# Patient Record
Sex: Female | Born: 1995 | Race: Black or African American | Hispanic: No | Marital: Single | Smoking: Never smoker
Health system: Southern US, Community
[De-identification: ages and names within clinical notes are randomized; demographics above are authoritative.]

## PROBLEM LIST (undated history)

## (undated) DIAGNOSIS — J45909 Unspecified asthma, uncomplicated: Secondary | ICD-10-CM

## (undated) DIAGNOSIS — L309 Dermatitis, unspecified: Secondary | ICD-10-CM

## (undated) DIAGNOSIS — D649 Anemia, unspecified: Secondary | ICD-10-CM

## (undated) DIAGNOSIS — IMO0002 Reserved for concepts with insufficient information to code with codable children: Secondary | ICD-10-CM

## (undated) DIAGNOSIS — F32A Depression, unspecified: Secondary | ICD-10-CM

## (undated) DIAGNOSIS — T7840XA Allergy, unspecified, initial encounter: Secondary | ICD-10-CM

## (undated) DIAGNOSIS — K219 Gastro-esophageal reflux disease without esophagitis: Secondary | ICD-10-CM

## (undated) DIAGNOSIS — R002 Palpitations: Secondary | ICD-10-CM

## (undated) DIAGNOSIS — F419 Anxiety disorder, unspecified: Secondary | ICD-10-CM

## (undated) HISTORY — PX: KNEE ARTHROSCOPY: SUR90

## (undated) HISTORY — DX: Allergy, unspecified, initial encounter: T78.40XA

## (undated) HISTORY — PX: WISDOM TOOTH EXTRACTION: SHX21

## (undated) HISTORY — DX: Reserved for concepts with insufficient information to code with codable children: IMO0002

## (undated) HISTORY — DX: Depression, unspecified: F32.A

## (undated) HISTORY — PX: UPPER GI ENDOSCOPY: SHX6162

---

## 2018-10-23 DIAGNOSIS — J45909 Unspecified asthma, uncomplicated: Secondary | ICD-10-CM | POA: Insufficient documentation

## 2018-11-03 ENCOUNTER — Encounter (HOSPITAL_COMMUNITY): Payer: Self-pay | Admitting: Emergency Medicine

## 2018-11-03 ENCOUNTER — Emergency Department (HOSPITAL_COMMUNITY)
Admission: EM | Admit: 2018-11-03 | Discharge: 2018-11-03 | Disposition: A | Discharge status: Home / Self Care | Payer: BC Managed Care – PPO | Attending: Emergency Medicine | Admitting: Emergency Medicine

## 2018-11-03 ENCOUNTER — Other Ambulatory Visit: Payer: Self-pay

## 2018-11-03 DIAGNOSIS — Z79899 Other long term (current) drug therapy: Secondary | ICD-10-CM | POA: Diagnosis not present

## 2018-11-03 DIAGNOSIS — F419 Anxiety disorder, unspecified: Secondary | ICD-10-CM | POA: Insufficient documentation

## 2018-11-03 DIAGNOSIS — Z91040 Latex allergy status: Secondary | ICD-10-CM | POA: Diagnosis not present

## 2018-11-03 DIAGNOSIS — R000 Tachycardia, unspecified: Secondary | ICD-10-CM | POA: Diagnosis present

## 2018-11-03 DIAGNOSIS — F410 Panic disorder [episodic paroxysmal anxiety] without agoraphobia: Secondary | ICD-10-CM

## 2018-11-03 HISTORY — DX: Unspecified asthma, uncomplicated: J45.909

## 2018-11-03 HISTORY — DX: Anxiety disorder, unspecified: F41.9

## 2018-11-03 HISTORY — DX: Dermatitis, unspecified: L30.9

## 2018-11-03 MED ORDER — LORAZEPAM 0.5 MG PO TABS: 0.5000 mg | ORAL_TABLET | Freq: Two times a day (BID) | ORAL | 0 refills | Status: DC | PRN

## 2018-11-03 MED ORDER — LORAZEPAM 1 MG PO TABS
1.0000 mg | ORAL_TABLET | Freq: Once | ORAL | Status: AC
  Administered 2018-11-03: 1 mg via ORAL
  Filled 2018-11-03: qty 1

## 2018-11-03 NOTE — Discharge Instructions (Signed)
Avoid stimulants, such as caffeine, as well as drug use. We advise follow up with a primary care doctor regarding your persistent anxiety. Use Ativan as needed for severe symptoms.

## 2018-11-03 NOTE — ED Triage Notes (Addendum)
Patient heart is racing and having anxiety issues. Patient states that it started out of no where. Patient used CBD oil earlier tonight.

## 2018-11-03 NOTE — ED Provider Notes (Signed)
[facility]-EMERGENCY DEPT Provider Note   CSN: [id number] Arrival date & time: 11/03/18  0132    History   Chief Complaint Chief Complaint  Patient presents with  . Tachycardia  . Anxiety    HPI Cheyenne Schwartz is a 22 y.o. female.  22 year old female with a history of anxiety, asthma, eczema presents to the emergency department for worsening shortness of breath and palpitations.  States that her heart has been racing intermittently since experiencing worsening anxiety.  She states that her semester has been more stressful.  She also frequently worries about her sister who has a history of seizure disorder.  She has experienced a globus sensation with her palpitations this evening.  No inability to swallow, drooling.  Her symptoms feel heightened when she is trying to go to sleep.  Has been taking Atarax for her eczema.  Was told by her doctor that this should help with her anxiety, but she does not feel as though it is controlling the symptoms.  Denies any situational stressors tonight.  Was given some CBD oil earlier by her roommate to try and help alleviate her palpitations and shortness of breath.  Denies any other stimulant or illicit drug use.  The history is provided by the patient. No language interpreter was used.    Past Medical History:  Diagnosis Date  . Anxiety   . Asthma   . Eczema     There are no active problems to display for this patient.   History reviewed. No pertinent surgical history.   OB History   None      Home Medications    Prior to Admission medications   Medication Sig Start Date End Date Taking? Authorizing Provider  hydrOXYzine (ATARAX/VISTARIL) 25 MG tablet Take 25-50 mg by mouth every 8 (eight) hours as needed for itching.  08/06/18  Yes [provider]  montelukast (SINGULAIR) 10 MG tablet Take 10 mg by mouth daily. 08/26/18  Yes [provider]  PROAIR RESPICLICK 108 (90 Base) MCG/ACT AEPB Inhale 2  puffs into the lungs every 6 (six) hours as needed (SOB, wheezing).  10/30/18  Yes [provider]  LORazepam (ATIVAN) 0.5 MG tablet Take 1 tablet (0.5 mg total) by mouth every 12 (twelve) hours as needed for anxiety. 11/03/18   Antony MaduraHumes, Harvie Morua, PA-C    Family History History reviewed. No pertinent family history.  Social History Social History   Tobacco Use  . Smoking status: Never Smoker  . Smokeless tobacco: Never Used  Substance Use Topics  . Alcohol use: Yes    Frequency: Never  . Drug use: Never     Allergies   Chocolate; Latex; Pollen extract; and Shellfish allergy   Review of Systems Review of Systems Ten systems reviewed and are negative for acute change, except as noted in the HPI.    Physical Exam Updated Vital Signs BP 130/81 (BP Location: Left Arm)   Pulse 99   Temp 97.9 F (36.6 C) (Oral)   Resp 15   Ht 5\' 5"  (1.651 m)   Wt 77.1 kg   LMP 10/21/2018   SpO2 100%   BMI 28.29 kg/m   Physical Exam  Constitutional: She is oriented to person, place, and time. She appears well-developed and well-nourished. No distress.  Visibly anxious. In NAD.  HENT:  Head: Normocephalic and atraumatic.  Eyes: Conjunctivae and EOM are normal. No scleral icterus.  Neck: Normal range of motion.  Cardiovascular: Regular rhythm and intact distal pulses.  Tachycardic  Pulmonary/Chest: Effort normal. No stridor. No respiratory distress. She has no wheezes.  Dyspneic without tachypnea. Lungs CTAB.  Musculoskeletal: Normal range of motion.  No BLE edema.  Neurological: She is alert and oriented to person, place, and time. She exhibits normal muscle tone. Coordination normal.  Skin: Skin is warm and dry. No rash noted. She is not diaphoretic. No erythema. No pallor.  Psychiatric: Her behavior is normal. Her mood appears anxious.  Nursing note and vitals reviewed.    ED Treatments / Results  Labs (all labs ordered are listed, but only abnormal results are  displayed) Labs Reviewed - No data to display  EKG EKG Interpretation  Date/Time:  Sunday November 03 2018 01:52:43 EST Ventricular Rate:  102 PR Interval:    QRS Duration: 76 QT Interval:  323 QTC Calculation: 421 R Axis:   38 Text Interpretation:  Sinus tachycardia Multiple premature complexes, vent & supraven Probable left atrial enlargement NO STEMI No old tracing to compare Confirmed by Cardama, Pedro ([id number]) on 11/03/2018 4:53:46 AM   Radiology No results found.  Procedures Procedures (including critical care time)  Medications Ordered in ED Medications  LORazepam (ATIVAN) tablet 1 mg (1 mg Oral Given 11/03/18 0229)     Initial Impression / Assessment and Plan / ED Course  I have reviewed the triage vital signs and the nursing notes.  Pertinent labs & imaging results that were available during my care of the patient were reviewed by me and considered in my medical decision making (see chart for details).     22 year old female presents to the ED for complaints of palpitations with shortness of breath and globus sensation.  Has been experiencing intermittent anxiety attacks.  Is not currently on any maintenance medications for anxiety.  She had symptomatic improvable in the ED following administration of Ativan.  She has never had any tachypnea, dyspnea, hypoxia.  No leg swelling, hemoptysis, recent surgeries or hospitalizations.  She is not on birth control.  Palpitations attributed to worsening anxiety.  The patient has been advised to follow-up with her primary care doctor to discuss daily maintenance medications for ongoing management.  She was given a short course of Ativan for home use.  Return precautions discussed and provided. Patient discharged in stable condition with no unaddressed concerns.   Final Clinical Impressions(s) / ED Diagnoses   Final diagnoses:  Anxiety attack    ED Discharge Orders         Ordered    LORazepam (ATIVAN) 0.5 MG tablet  Every  12 hours PRN     12 /08/19 0403           Antony Madura, PA-C 11/03/18 0457    Nira Conn, MD 11/03/18 [id number]

## 2018-12-08 ENCOUNTER — Emergency Department (HOSPITAL_COMMUNITY): Payer: BC Managed Care – PPO

## 2018-12-08 ENCOUNTER — Encounter (HOSPITAL_COMMUNITY): Payer: Self-pay | Admitting: Emergency Medicine

## 2018-12-08 ENCOUNTER — Emergency Department (HOSPITAL_COMMUNITY)
Admission: EM | Admit: 2018-12-08 | Discharge: 2018-12-08 | Disposition: A | Discharge status: Home / Self Care | Payer: BC Managed Care – PPO | Attending: Emergency Medicine | Admitting: Emergency Medicine

## 2018-12-08 ENCOUNTER — Other Ambulatory Visit: Payer: Self-pay

## 2018-12-08 DIAGNOSIS — R000 Tachycardia, unspecified: Secondary | ICD-10-CM | POA: Insufficient documentation

## 2018-12-08 DIAGNOSIS — R070 Pain in throat: Secondary | ICD-10-CM | POA: Insufficient documentation

## 2018-12-08 DIAGNOSIS — J45909 Unspecified asthma, uncomplicated: Secondary | ICD-10-CM | POA: Insufficient documentation

## 2018-12-08 DIAGNOSIS — E876 Hypokalemia: Secondary | ICD-10-CM | POA: Insufficient documentation

## 2018-12-08 DIAGNOSIS — Z79899 Other long term (current) drug therapy: Secondary | ICD-10-CM | POA: Insufficient documentation

## 2018-12-08 DIAGNOSIS — R0602 Shortness of breath: Secondary | ICD-10-CM | POA: Insufficient documentation

## 2018-12-08 DIAGNOSIS — R030 Elevated blood-pressure reading, without diagnosis of hypertension: Secondary | ICD-10-CM | POA: Insufficient documentation

## 2018-12-08 DIAGNOSIS — R1013 Epigastric pain: Secondary | ICD-10-CM | POA: Diagnosis not present

## 2018-12-08 DIAGNOSIS — R0789 Other chest pain: Secondary | ICD-10-CM | POA: Insufficient documentation

## 2018-12-08 DIAGNOSIS — R1310 Dysphagia, unspecified: Secondary | ICD-10-CM | POA: Insufficient documentation

## 2018-12-08 DIAGNOSIS — R002 Palpitations: Secondary | ICD-10-CM | POA: Insufficient documentation

## 2018-12-08 DIAGNOSIS — Z91040 Latex allergy status: Secondary | ICD-10-CM | POA: Insufficient documentation

## 2018-12-08 DIAGNOSIS — R079 Chest pain, unspecified: Secondary | ICD-10-CM

## 2018-12-08 LAB — BASIC METABOLIC PANEL
Anion gap: 9 (ref 5–15)
BUN: <5 mg/dL — ABNORMAL LOW (ref 6–20)
CO2: 22 mmol/L (ref 22–32)
CREATININE: 0.71 mg/dL (ref 0.44–1.00)
Calcium: 10.1 mg/dL (ref 8.9–10.3)
Chloride: 102 mmol/L (ref 98–111)
GFR calc Af Amer: >60 mL/min (ref >60)
GFR calc non Af Amer: >60 mL/min (ref >60)
Glucose, Bld: 103 mg/dL — ABNORMAL HIGH (ref 70–99)
Potassium: 3.4 mmol/L — ABNORMAL LOW (ref 3.5–5.1)
Sodium: 133 mmol/L — ABNORMAL LOW (ref 135–145)

## 2018-12-08 LAB — CBC WITH DIFFERENTIAL/PLATELET
Abs Immature Granulocytes: 0.02 10*3/uL (ref 0.00–0.07)
Basophils Absolute: 0.1 10*3/uL (ref 0.0–0.1)
Basophils Relative: 1 %
Eosinophils Absolute: 0.1 10*3/uL (ref 0.0–0.5)
Eosinophils Relative: 2 %
HCT: 37.2 % (ref 36.0–46.0)
Hemoglobin: 11.7 g/dL — ABNORMAL LOW (ref 12.0–15.0)
Immature Granulocytes: 0 %
Lymphocytes Relative: 23 %
Lymphs Abs: 1.5 10*3/uL (ref 0.7–4.0)
MCH: 24 pg — ABNORMAL LOW (ref 26.0–34.0)
MCHC: 31.5 g/dL (ref 30.0–36.0)
MCV: 76.4 fL — ABNORMAL LOW (ref 80.0–100.0)
Monocytes Absolute: 0.6 10*3/uL (ref 0.1–1.0)
Monocytes Relative: 9 %
Neutro Abs: 4.4 10*3/uL (ref 1.7–7.7)
Neutrophils Relative %: 65 %
Platelets: 404 10*3/uL — ABNORMAL HIGH (ref 150–400)
RBC: 4.87 MIL/uL (ref 3.87–5.11)
RDW: 15.9 % — ABNORMAL HIGH (ref 11.5–15.5)
WBC: 6.7 10*3/uL (ref 4.0–10.5)
nRBC: 0 % (ref 0.0–0.2)

## 2018-12-08 LAB — I-STAT BETA HCG BLOOD, ED (MC, WL, AP ONLY)

## 2018-12-08 LAB — D-DIMER, QUANTITATIVE: D-Dimer, Quant: 1.17 ug/mL-FEU — ABNORMAL HIGH (ref 0.00–0.50)

## 2018-12-08 LAB — TSH: TSH: 0.439 u[IU]/mL (ref 0.350–4.500)

## 2018-12-08 MED ORDER — SODIUM CHLORIDE 0.9 % IV BOLUS
1000.0000 mL | Freq: Once | INTRAVENOUS | Status: AC
  Administered 2018-12-08: 1000 mL via INTRAVENOUS

## 2018-12-08 MED ORDER — PANTOPRAZOLE SODIUM 20 MG PO TBEC: 20.0000 mg | DELAYED_RELEASE_TABLET | Freq: Every day | ORAL | 0 refills | Status: DC

## 2018-12-08 MED ORDER — IOPAMIDOL (ISOVUE-370) INJECTION 76%
INTRAVENOUS | Status: AC
  Administered 2018-12-08: 50 mL
  Filled 2018-12-08: qty 50

## 2018-12-08 MED ORDER — IOPAMIDOL (ISOVUE-370) INJECTION 76%
INTRAVENOUS | Status: AC
  Filled 2018-12-08: qty 50

## 2018-12-08 MED ORDER — POTASSIUM CHLORIDE CRYS ER 20 MEQ PO TBCR
40.0000 meq | EXTENDED_RELEASE_TABLET | Freq: Once | ORAL | Status: AC
  Administered 2018-12-08: 40 meq via ORAL
  Filled 2018-12-08: qty 2

## 2018-12-08 NOTE — ED Provider Notes (Signed)
[facility] EMERGENCY DEPARTMENT Provider Note   CSN: [id number] Arrival date & time: 12/08/18  1441     History   Chief Complaint Chief Complaint  Patient presents with  . chest wall pain    HPI Cheyenne Schwartz is a 23 y.o. female who presents with chest pain. PMH significant for anxiety, asthma, eczema. She states that she's been having central chest pain and throat pain for the past week. She has been seen for this problem twice - once here on 12/8 and once at [facility] on 12/19. She states that the pain is intermittent. It radiates to her throat. She reports associated palpitations. The palpitations are a chronic problem and she has seen cardiology in the past and had normal holter monitoring and echocardiogram. No aggravating or alleviating factors. She has been taking Zoloft for anxiety for the past three weeks and feels it is helping her anxiety and she reports not feeling very stressed. After she was seen at [facility] the pain went away for about 2 weeks and now it's back. No fever, chills, URI symptoms, cough, SOB, abdominal pain, N/V. She reports dysphagia and odynophagia. She had an appointment with GI but cancelled it. She tried Omeprazole for 2 weeks but this didn't help.   HPI  Past Medical History:  Diagnosis Date  . Anxiety   . Asthma   . Eczema     There are no active problems to display for this patient.   History reviewed. No pertinent surgical history.   OB History   No obstetric history on file.      Home Medications    Prior to Admission medications   Medication Sig Start Date End Date Taking? Authorizing Provider  hydrOXYzine (ATARAX/VISTARIL) 25 MG tablet Take 25-50 mg by mouth every 8 (eight) hours as needed for itching.  08/06/18   [provider]  LORazepam (ATIVAN) 0.5 MG tablet Take 1 tablet (0.5 mg total) by mouth every 12 (twelve) hours as needed for anxiety. 11/03/18   Antony Madura, PA-C  montelukast (SINGULAIR) 10 MG  tablet Take 10 mg by mouth daily. 08/26/18   [provider]  PROAIR RESPICLICK 108 (90 Base) MCG/ACT AEPB Inhale 2 puffs into the lungs every 6 (six) hours as needed (SOB, wheezing).  10/30/18   [provider]    Family History No family history on file.  Social History Social History   Tobacco Use  . Smoking status: Never Smoker  . Smokeless tobacco: Never Used  Substance Use Topics  . Alcohol use: Yes    Frequency: Never  . Drug use: Never     Allergies   Chocolate; Latex; Pollen extract; and Shellfish allergy   Review of Systems Review of Systems  Constitutional: Negative for fever.  HENT: Positive for sore throat and trouble swallowing. Negative for congestion, rhinorrhea and sneezing.   Respiratory: Negative for cough, shortness of breath and wheezing.   Cardiovascular: Positive for chest pain and palpitations. Negative for leg swelling.  Gastrointestinal: Negative for abdominal pain, nausea and vomiting.  Genitourinary: Negative for dysuria.  Neurological: Negative for syncope and light-headedness.  All other systems reviewed and are negative.    Physical Exam Updated Vital Signs BP (!) 139/101   Pulse (!) 110   Temp 98.6 F (37 C) (Oral)   Resp 16   Ht 5\' 5"  (1.651 m)   Wt 73.9 kg   LMP 11/12/2018   SpO2 100%   BMI 27.12 kg/m   Physical Exam  Vitals signs and nursing note reviewed.  Constitutional:      General: She is not in acute distress.    Appearance: Normal appearance. She is well-developed and well-groomed. She is not ill-appearing.  HENT:     Head: Normocephalic and atraumatic.     Mouth/Throat:     Mouth: Mucous membranes are moist.     Pharynx: Oropharynx is clear.  Eyes:     General: No scleral icterus.       Right eye: No discharge.        Left eye: No discharge.     Conjunctiva/sclera: Conjunctivae normal.     Pupils: Pupils are equal, round, and reactive to light.  Neck:     Musculoskeletal: Normal range of  motion.  Cardiovascular:     Rate and Rhythm: Tachycardia present.  Pulmonary:     Effort: Pulmonary effort is normal. No respiratory distress.     Breath sounds: Normal breath sounds.  Chest:     Chest wall: Tenderness (upper, central chest wall) present.  Abdominal:     General: Abdomen is flat. There is no distension.     Tenderness: There is abdominal tenderness (mild epigastric).  Skin:    General: Skin is warm and dry.  Neurological:     Mental Status: She is alert and oriented to person, place, and time.  Psychiatric:        Behavior: Behavior normal. Behavior is cooperative.      ED Treatments / Results  Labs (all labs ordered are listed, but only abnormal results are displayed) Labs Reviewed  BASIC METABOLIC PANEL - Abnormal; Notable for the following components:      Result Value   Sodium 133 (*)    Potassium 3.4 (*)    Glucose, Bld 103 (*)    BUN <5 (*)    All other components within normal limits  CBC WITH DIFFERENTIAL/PLATELET - Abnormal; Notable for the following components:   Hemoglobin 11.7 (*)    MCV 76.4 (*)    MCH 24.0 (*)    RDW 15.9 (*)    Platelets 404 (*)    All other components within normal limits  D-DIMER, QUANTITATIVE (NOT AT [facility]) - Abnormal; Notable for the following components:   D-Dimer, Quant 1.17 (*)    All other components within normal limits  TSH  I-STAT BETA HCG BLOOD, ED ([facility], [facility], [facility] ONLY)    EKG None  Radiology Ct Angio Chest Pe W/cm &/or Wo Cm  Result Date: 12/08/2018 CLINICAL DATA:  One month history of intermittent chest pain. EXAM: CT ANGIOGRAPHY CHEST WITH CONTRAST TECHNIQUE: Multidetector CT imaging of the chest was performed using the standard protocol during bolus administration of intravenous contrast. Multiplanar CT image reconstructions and MIPs were obtained to evaluate the vascular anatomy. CONTRAST:  50mL ISOVUE-370 IOPAMIDOL INJECTION 76% IV. COMPARISON:  None. FINDINGS: Cardiovascular: Contrast opacification of  pulmonary arteries is very good. No filling defects within either main pulmonary artery or their segmental branches in either lung to suggest pulmonary embolism. Upper normal heart size. No pericardial effusion. No coronary atherosclerosis. No visible atherosclerosis involving the thoracic or proximal abdominal aorta. Mediastinum/Nodes: No pathologically enlarged mediastinal, hilar or axillary lymph nodes. No mediastinal masses. Normal-appearing esophagus. Normal-appearing thyroid gland. Residual thymic tissue in the anterior superior mediastinum. Lungs/Pleura: Lung parenchyma clear without airspace consolidation, interstitial disease, or parenchymal nodules or masses. Central airways patent with moderate bronchial wall thickening. No pleural effusions. Upper Abdomen: Visualized upper abdomen unremarkable for the early arterial  phase of enhancement. Opaque ingested material is present within the stomach. Musculoskeletal: Regional skeleton unremarkable without acute or significant osseous abnormality. Review of the MIP images confirms the above findings. IMPRESSION: 1. No evidence of pulmonary embolism. 2. Moderate central bronchial wall thickening indicating asthma and/or bronchitis. No acute cardiopulmonary disease otherwise. Electronically Signed   By: Hulan Saashomas  Lawrence M.D.   On: 12/08/2018 19:09    Procedures Procedures (including critical care time)  Medications Ordered in ED Medications  iopamidol (ISOVUE-370) 76 % injection (has no administration in time range)  iopamidol (ISOVUE-370) 76 % injection (has no administration in time range)  sodium chloride 0.9 % bolus 1,000 mL (0 mLs Intravenous Stopped 12/08/18 2014)  potassium chloride SA (K-DUR,KLOR-CON) CR tablet 40 mEq (40 mEq Oral Given 12/08/18 1701)  iopamidol (ISOVUE-370) 76 % injection (50 mLs  Contrast Given 12/08/18 1815)     Initial Impression / Assessment and Plan / ED Course  I have reviewed the triage vital signs and the nursing  notes.  Pertinent labs & imaging results that were available during my care of the patient were reviewed by me and considered in my medical decision making (see chart for details).  23 year old female presents with intermittent chest pain, palpitations, shortness of breath and dysphasia.  This is her third evaluation for the same.  She has mild tachycardia and is mildly hypertensive but otherwise vital signs are normal.  On exam heart rate is slightly elevated and she has a normal rhythm.  Lungs are clear to auscultation.  She has reproducible chest tenderness on exam.  She has mild epigastric abdominal tenderness.  She has no appreciable leg swelling.  Unclear etiology of symptoms.  Due to tachycardia will obtain d-dimer and will also add TSH because of her palpitations although this sounds like it is been thoroughly worked up in the past.  EKG just shows sinus tachycardia.  CBC is remarkable for mild anemia.  BMP is remarkable for mild hypokalemia.  This was replaced.  Her d-dimer came back slightly elevated.  Will give fluids and obtain CT of the chest.  CT shows changes consistent with asthma/bronchitis.  There is no evidence of PE.  Her TSH is normal.  Her long discussion with the patient and her mother and father.  It sounds like she was posted follow-up with GI but since they made [place] in [place] she canceled it because she lives here now.  We will start her on Protonix and see if this helps.  We will also refer her to GI.  She was given return precautions.  Final Clinical Impressions(s) / ED Diagnoses   Final diagnoses:  Chest pain, unspecified type  Palpitations  Hypokalemia  Dysphagia, unspecified type    ED Discharge Orders    None       Bethel BornGekas, Kelly Marie, PA-C 12/08/18 2048    Sabas SousBero, Michael M, MD 12/09/18 1039

## 2018-12-08 NOTE — Discharge Instructions (Signed)
Start Protonix and take once daily Try to each potassium rich foods and stay hydrated Follow up with GI Return if worsening

## 2018-12-08 NOTE — ED Triage Notes (Signed)
Per [facility]- Pt picked up from home c/o chest wall pain x24month. Pt has hx of anxiety and panic attacks. Started on zoloft 2weeks ago. Was seen here at the ED  2weeks ago, for same issue

## 2019-01-18 ENCOUNTER — Emergency Department (HOSPITAL_COMMUNITY)
Admission: EM | Admit: 2019-01-18 | Discharge: 2019-01-18 | Disposition: A | Discharge status: Other Acute Inpatient Hospital | Payer: BC Managed Care – PPO | Source: Home / Self Care | Attending: Emergency Medicine | Admitting: Emergency Medicine

## 2019-01-18 ENCOUNTER — Ambulatory Visit (HOSPITAL_COMMUNITY)
Admission: RE | Admit: 2019-01-18 | Discharge: 2019-01-18 | Disposition: A | Discharge status: Home / Self Care | Payer: BC Managed Care – PPO | Source: Home / Self Care | Attending: Psychiatry | Admitting: Psychiatry

## 2019-01-18 DIAGNOSIS — Z91040 Latex allergy status: Secondary | ICD-10-CM

## 2019-01-18 DIAGNOSIS — F329 Major depressive disorder, single episode, unspecified: Secondary | ICD-10-CM | POA: Insufficient documentation

## 2019-01-18 DIAGNOSIS — R45851 Suicidal ideations: Secondary | ICD-10-CM | POA: Diagnosis not present

## 2019-01-18 DIAGNOSIS — J45909 Unspecified asthma, uncomplicated: Secondary | ICD-10-CM | POA: Insufficient documentation

## 2019-01-18 DIAGNOSIS — Z91010 Allergy to peanuts: Secondary | ICD-10-CM | POA: Insufficient documentation

## 2019-01-18 DIAGNOSIS — F332 Major depressive disorder, recurrent severe without psychotic features: Secondary | ICD-10-CM | POA: Diagnosis not present

## 2019-01-18 DIAGNOSIS — Z79899 Other long term (current) drug therapy: Secondary | ICD-10-CM

## 2019-01-18 LAB — COMPREHENSIVE METABOLIC PANEL
ALK PHOS: 65 U/L (ref 38–126)
ALT: 20 U/L (ref 0–44)
AST: 27 U/L (ref 15–41)
Albumin: 4 g/dL (ref 3.5–5.0)
Anion gap: 10 (ref 5–15)
BUN: 9 mg/dL (ref 6–20)
CO2: 25 mmol/L (ref 22–32)
Calcium: 9.7 mg/dL (ref 8.9–10.3)
Chloride: 103 mmol/L (ref 98–111)
Creatinine, Ser: 0.74 mg/dL (ref 0.44–1.00)
GFR calc non Af Amer: >60 mL/min (ref >60)
Glucose, Bld: 102 mg/dL — ABNORMAL HIGH (ref 70–99)
Potassium: 3.9 mmol/L (ref 3.5–5.1)
Sodium: 138 mmol/L (ref 135–145)
Total Bilirubin: 0.6 mg/dL (ref 0.3–1.2)
Total Protein: 7.7 g/dL (ref 6.5–8.1)

## 2019-01-18 LAB — CBC WITH DIFFERENTIAL/PLATELET
Abs Immature Granulocytes: 0.01 10*3/uL (ref 0.00–0.07)
Basophils Absolute: 0.1 10*3/uL (ref 0.0–0.1)
Basophils Relative: 1 %
Eosinophils Absolute: 0.2 10*3/uL (ref 0.0–0.5)
Eosinophils Relative: 2 %
HCT: 35.1 % — ABNORMAL LOW (ref 36.0–46.0)
Hemoglobin: 11 g/dL — ABNORMAL LOW (ref 12.0–15.0)
Immature Granulocytes: 0 %
Lymphocytes Relative: 36 %
Lymphs Abs: 2.7 10*3/uL (ref 0.7–4.0)
MCH: 24.5 pg — ABNORMAL LOW (ref 26.0–34.0)
MCHC: 31.3 g/dL (ref 30.0–36.0)
MCV: 78.2 fL — ABNORMAL LOW (ref 80.0–100.0)
Monocytes Absolute: 0.7 10*3/uL (ref 0.1–1.0)
Monocytes Relative: 10 %
Neutro Abs: 3.8 10*3/uL (ref 1.7–7.7)
Neutrophils Relative %: 51 %
Platelets: 496 10*3/uL — ABNORMAL HIGH (ref 150–400)
RBC: 4.49 MIL/uL (ref 3.87–5.11)
RDW: 14.3 % (ref 11.5–15.5)
WBC: 7.5 10*3/uL (ref 4.0–10.5)
nRBC: 0 % (ref 0.0–0.2)

## 2019-01-18 LAB — POC URINE PREG, ED: PREG TEST UR: NEGATIVE

## 2019-01-18 LAB — SALICYLATE LEVEL: Salicylate Lvl: <7 mg/dL (ref 2.8–30.0)

## 2019-01-18 LAB — RAPID URINE DRUG SCREEN, HOSP PERFORMED
Amphetamines: NOT DETECTED
Barbiturates: NOT DETECTED
Benzodiazepines: NOT DETECTED
Cocaine: NOT DETECTED
Opiates: NOT DETECTED
Tetrahydrocannabinol: NOT DETECTED

## 2019-01-18 LAB — ACETAMINOPHEN LEVEL: Acetaminophen (Tylenol), Serum: <10 ug/mL — ABNORMAL LOW (ref 10–30)

## 2019-01-18 LAB — ETHANOL: Alcohol, Ethyl (B): <10 mg/dL (ref <10)

## 2019-01-18 LAB — I-STAT BETA HCG BLOOD, ED (MC, WL, AP ONLY): I-stat hCG, quantitative: <5 m[IU]/mL (ref <5)

## 2019-01-18 MED ORDER — SERTRALINE HCL 100 MG PO TABS
100.0000 mg | ORAL_TABLET | Freq: Once | ORAL | Status: AC
  Administered 2019-01-18: 100 mg via ORAL
  Filled 2019-01-18: qty 1

## 2019-01-18 NOTE — ED Notes (Signed)
TTS in progress 

## 2019-01-18 NOTE — ED Provider Notes (Signed)
[facility] EMERGENCY DEPARTMENT Provider Note   CSN: [id number] Arrival date & time: 01/18/19  1740    History   Chief Complaint No chief complaint on file.   HPI Cheyenne Schwartz is a 23 y.o. female.      Mental Health Problem  Presenting symptoms: suicidal thoughts and suicidal threats   Presenting symptoms: no agitation, no hallucinations, no homicidal ideas and no self-mutilation   Patient accompanied by: Crisis personnel after patient called the hotline. Degree of incapacity (severity):  Severe Onset quality:  Sudden Timing:  Constant Progression:  Unchanged Chronicity:  Recurrent Context: noncompliance, recent medication change and stressful life event   Treatment compliance:  All of the time Relieved by:  Nothing Worsened by:  Nothing Ineffective treatments:  Antidepressants Associated symptoms: feelings of worthlessness and hypersomnia   Associated symptoms: no abdominal pain and no chest pain   Risk factors: hx of mental illness   Risk factors: no hx of suicide attempts     Past Medical History:  Diagnosis Date  . Anxiety   . Asthma   . Eczema     There are no active problems to display for this patient.   No past surgical history on file.   OB History   No obstetric history on file.      Home Medications    Prior to Admission medications   Medication Sig Start Date End Date Taking? Authorizing Provider  cetirizine (ZYRTEC ALLERGY) 10 MG tablet Take 10 mg by mouth daily. 12/08/08  Yes [provider]  Ferrous Sulfate (IRON PO) Take 1 tablet by mouth daily.   Yes [provider]  FLOVENT HFA 110 MCG/ACT inhaler Inhale 2 puffs into the lungs 2 (two) times daily. 12/23/18  Yes [provider]  fluticasone (FLONASE) 50 MCG/ACT nasal spray Place 2 sprays into both nostrils daily as needed for rhinitis.  10/03/16  Yes [provider]  hydrOXYzine (ATARAX/VISTARIL) 25 MG tablet Take 25-50 mg by mouth  every 8 (eight) hours as needed for itching.  08/06/18  Yes [provider]  LORazepam (ATIVAN) 0.5 MG tablet Take 1 tablet (0.5 mg total) by mouth every 12 (twelve) hours as needed for anxiety. 11/03/18  Yes Antony Madura, PA-C  montelukast (SINGULAIR) 10 MG tablet Take 10 mg by mouth daily. 08/26/18  Yes [provider]  pantoprazole (PROTONIX) 20 MG tablet Take 1 tablet (20 mg total) by mouth daily. 12/08/18  Yes Bethel Born, PA-C  PROAIR RESPICLICK 108 [phone] Base) MCG/ACT AEPB Inhale 2 puffs into the lungs every 6 (six) hours as needed (SOB, wheezing).  10/30/18  Yes [provider]  sertraline (ZOLOFT) 100 MG tablet Take 100 mg by mouth daily. 12/30/18  Yes [provider]  triamcinolone ointment (KENALOG) 0.1 % Apply 1 application topically daily as needed.   Yes [provider]    Family History No family history on file.  Social History Social History   Tobacco Use  . Smoking status: Never Smoker  . Smokeless tobacco: Never Used  Substance Use Topics  . Alcohol use: Yes    Frequency: Never  . Drug use: Never     Allergies   Apple; Chocolate flavor; Fish allergy; Banana; Bee pollen; Latex; Peanut oil; Pollen extract; and Shellfish allergy   Review of Systems Review of Systems  Constitutional: Negative for chills and fever.  HENT: Negative for ear pain and sore throat.   Eyes: Negative for pain and visual disturbance.  Respiratory: Negative  for cough and shortness of breath.   Cardiovascular: Negative for chest pain and palpitations.  Gastrointestinal: Negative for abdominal pain and vomiting.  Genitourinary: Negative for dysuria and hematuria.  Musculoskeletal: Negative for arthralgias and back pain.  Skin: Negative for color change and rash.  Neurological: Negative for seizures and syncope.  Psychiatric/Behavioral: Positive for suicidal ideas. Negative for agitation, behavioral problems, hallucinations, homicidal ideas and  self-injury.  All other systems reviewed and are negative.    Physical Exam Updated Vital Signs BP 114/68 (BP Location: Right Arm)   Pulse 68   Temp 98 F (36.7 C) (Oral)   Resp 17   SpO2 100%   Physical Exam Vitals signs and nursing note reviewed.  Constitutional:      General: She is not in acute distress.    Appearance: She is well-developed.     Comments: Patient resting comfortably, no acute distress.  HENT:     Head: Normocephalic and atraumatic.  Eyes:     Conjunctiva/sclera: Conjunctivae normal.  Neck:     Musculoskeletal: Neck supple.  Cardiovascular:     Rate and Rhythm: Normal rate and regular rhythm.     Heart sounds: No murmur.  Pulmonary:     Effort: Pulmonary effort is normal. No respiratory distress.     Breath sounds: Normal breath sounds.  Abdominal:     Palpations: Abdomen is soft.     Tenderness: There is no abdominal tenderness.  Musculoskeletal: Normal range of motion.  Skin:    General: Skin is warm and dry.     Capillary Refill: Capillary refill takes less than 2 seconds.     Comments: No signs of self-mutilation.  Neurological:     General: No focal deficit present.     Mental Status: She is alert.  Psychiatric:        Mood and Affect: Mood normal.        Thought Content: Thought content is not paranoid or delusional. Thought content includes suicidal ideation. Thought content does not include homicidal ideation. Thought content includes suicidal plan. Thought content does not include homicidal plan.      ED Treatments / Results  Labs (all labs ordered are listed, but only abnormal results are displayed) Labs Reviewed  COMPREHENSIVE METABOLIC PANEL - Abnormal; Notable for the following components:      Result Value   Glucose, Bld 102 (*)    All other components within normal limits  CBC WITH DIFFERENTIAL/PLATELET - Abnormal; Notable for the following components:   Hemoglobin 11.0 (*)    HCT 35.1 (*)    MCV 78.2 (*)    MCH 24.5 (*)     Platelets 496 (*)    All other components within normal limits  ACETAMINOPHEN LEVEL - Abnormal; Notable for the following components:   Acetaminophen (Tylenol), Serum <10 (*)    All other components within normal limits  ETHANOL  RAPID URINE DRUG SCREEN, HOSP PERFORMED  SALICYLATE LEVEL  I-STAT BETA HCG BLOOD, ED ([facility], [facility], [facility] ONLY)  POC URINE PREG, ED    EKG None  Radiology No results found.  Procedures Procedures (including critical care time)  Medications Ordered in ED Medications  sertraline (ZOLOFT) tablet 100 mg (100 mg Oral Given 01/18/19 2145)     Initial Impression / Assessment and Plan / ED Course  I have reviewed the triage vital signs and the nursing notes.  Pertinent labs & imaging results that were available during my care of the patient were reviewed by me and  considered in my medical decision making (see chart for details).        23 year old female significant past medical history of asthma, depression who is currently on Zoloft since December which was prescribed by her primary care provider.  Patient negates that she has been dealing with compression over the last few years and is been seeing a counselor at school however has had worsening thoughts of suicide due to a recent break-up and feels quite helpless.  Patient indicates that she called the hotline for suicide because she was planning on overdosing on her medications.  Patient was brought by crisis provider.  Patient resting comfortably on arrival, hemodynamically stable.  Patient denies taking medications or overdosing.  Physical exam negative for any acute findings such as tachycardia, diaphoresis, self-mutilation.  Will obtain baseline laboratory studies, consult DTS as well as rule out IVC at this time due to active suicidality.  Laboratory studies reviewed, no acute findings.  Patient given Zoloft for today per home dose. psychiatry agreed with inpatient criteria.  Patient moved to behavioral  hold in stable condition with stable vital signs.  The above care was discussed and agreed upon by my attending physician.  Final Clinical Impressions(s) / ED Diagnoses   Final diagnoses:  Suicidal ideation    ED Discharge Orders    None       Dahlia Client, MD 01/18/19 6203    Arby Barrette, MD 01/29/19 1236

## 2019-01-18 NOTE — ED Notes (Signed)
Nira Conn, NP, patient meets inpatient criteria. TTS to seek placement. Sherrilyn Rist, RN, informed of disposition.

## 2019-01-18 NOTE — ED Notes (Signed)
Patient reports that she is suicidal, but has no plans. She reports to have started taking zoloft in December but still feels hopeless.

## 2019-01-18 NOTE — ED Notes (Signed)
Cheyenne Schwartz, patient accepted to [facility] Adult Unit Room 401 Bed 2. Dr. Jama Flavors is accepting. Patient can come now. Sherrilyn Rist, RN, informed of disposition and acceptance. Nurse to nurse report to [phone].

## 2019-01-18 NOTE — ED Provider Notes (Signed)
I saw and evaluated the patient, reviewed the resident's note and I agree with the findings and plan.  EKG: None    Arby Barrette, MD 01/29/19 1237

## 2019-01-18 NOTE — BH Assessment (Signed)
[facility] Assessment Progress Note  Patient initially walked-in to [facility] for an assessment and waited to be seen for approximately forty-five minutes, but then walked out and went to [facility].  Patient did not notify staff that she was leaving [place]. Staff at [facility] was just getting ready to contact [employer] when patient arrived at [facility] ED

## 2019-01-18 NOTE — ED Notes (Signed)
Meal tray given to pt.
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
## 2019-01-19 ENCOUNTER — Encounter (HOSPITAL_COMMUNITY): Payer: Self-pay | Admitting: *Deleted

## 2019-01-19 ENCOUNTER — Other Ambulatory Visit: Payer: Self-pay

## 2019-01-19 ENCOUNTER — Inpatient Hospital Stay (HOSPITAL_COMMUNITY)
Admission: AD | Admit: 2019-01-19 | Discharge: 2019-01-21 | DRG: 885 | Disposition: A | Discharge status: Home / Self Care | Payer: BC Managed Care – PPO | Attending: Psychiatry | Admitting: Psychiatry

## 2019-01-19 DIAGNOSIS — Z91030 Bee allergy status: Secondary | ICD-10-CM | POA: Diagnosis not present

## 2019-01-19 DIAGNOSIS — F4000 Agoraphobia, unspecified: Secondary | ICD-10-CM | POA: Diagnosis present

## 2019-01-19 DIAGNOSIS — Z91048 Other nonmedicinal substance allergy status: Secondary | ICD-10-CM

## 2019-01-19 DIAGNOSIS — R45851 Suicidal ideations: Secondary | ICD-10-CM | POA: Diagnosis present

## 2019-01-19 DIAGNOSIS — F410 Panic disorder [episodic paroxysmal anxiety] without agoraphobia: Secondary | ICD-10-CM | POA: Diagnosis present

## 2019-01-19 DIAGNOSIS — J45909 Unspecified asthma, uncomplicated: Secondary | ICD-10-CM | POA: Diagnosis present

## 2019-01-19 DIAGNOSIS — Z79899 Other long term (current) drug therapy: Secondary | ICD-10-CM

## 2019-01-19 DIAGNOSIS — F332 Major depressive disorder, recurrent severe without psychotic features: Secondary | ICD-10-CM | POA: Diagnosis present

## 2019-01-19 DIAGNOSIS — Z91013 Allergy to seafood: Secondary | ICD-10-CM

## 2019-01-19 DIAGNOSIS — G4710 Hypersomnia, unspecified: Secondary | ICD-10-CM | POA: Diagnosis present

## 2019-01-19 DIAGNOSIS — Z91018 Allergy to other foods: Secondary | ICD-10-CM

## 2019-01-19 LAB — LIPID PANEL
Cholesterol: 260 mg/dL — ABNORMAL HIGH (ref 0–200)
HDL: 77 mg/dL (ref >40)
LDL Cholesterol: 171 mg/dL — ABNORMAL HIGH (ref 0–99)
Total CHOL/HDL Ratio: 3.4 RATIO
Triglycerides: 61 mg/dL (ref <150)
VLDL: 12 mg/dL (ref 0–40)

## 2019-01-19 LAB — HEMOGLOBIN A1C
Hgb A1c MFr Bld: 4.7 % — ABNORMAL LOW (ref 4.8–5.6)
Mean Plasma Glucose: 88.19 mg/dL

## 2019-01-19 LAB — TSH: TSH: 1.197 u[IU]/mL (ref 0.350–4.500)

## 2019-01-19 MED ORDER — HYDROXYZINE HCL 25 MG PO TABS
25.0000 mg | ORAL_TABLET | Freq: Three times a day (TID) | ORAL | Status: DC | PRN
  Administered 2019-01-19: 25 mg via ORAL
  Filled 2019-01-19 (×2): qty 1

## 2019-01-19 MED ORDER — MONTELUKAST SODIUM 10 MG PO TABS
10.0000 mg | ORAL_TABLET | Freq: Every day | ORAL | Status: DC
  Administered 2019-01-19 – 2019-01-21 (×3): 10 mg via ORAL
  Filled 2019-01-19 (×5): qty 1

## 2019-01-19 MED ORDER — FLUTICASONE PROPIONATE HFA 110 MCG/ACT IN AERO
2.0000 | INHALATION_SPRAY | Freq: Two times a day (BID) | RESPIRATORY_TRACT | Status: DC
  Administered 2019-01-19 – 2019-01-21 (×5): 2 via RESPIRATORY_TRACT
  Filled 2019-01-19: qty 12

## 2019-01-19 MED ORDER — ACETAMINOPHEN 325 MG PO TABS: 650.0000 mg | ORAL_TABLET | Freq: Four times a day (QID) | ORAL | Status: DC | PRN

## 2019-01-19 MED ORDER — PANTOPRAZOLE SODIUM 20 MG PO TBEC
20.0000 mg | DELAYED_RELEASE_TABLET | Freq: Every day | ORAL | Status: DC
  Administered 2019-01-19 – 2019-01-21 (×3): 20 mg via ORAL
  Filled 2019-01-19 (×5): qty 1

## 2019-01-19 MED ORDER — FLUTICASONE PROPIONATE 50 MCG/ACT NA SUSP: 2.0000 | Freq: Every day | NASAL | Status: DC | PRN

## 2019-01-19 MED ORDER — ALBUTEROL SULFATE HFA 108 (90 BASE) MCG/ACT IN AERS: 2.0000 | INHALATION_SPRAY | Freq: Four times a day (QID) | RESPIRATORY_TRACT | Status: DC | PRN

## 2019-01-19 MED ORDER — MAGNESIUM HYDROXIDE 400 MG/5ML PO SUSP: 30.0000 mL | Freq: Every day | ORAL | Status: DC | PRN

## 2019-01-19 MED ORDER — SERTRALINE HCL 100 MG PO TABS
100.0000 mg | ORAL_TABLET | Freq: Every day | ORAL | Status: DC
  Administered 2019-01-19 – 2019-01-20 (×2): 100 mg via ORAL
  Filled 2019-01-19 (×4): qty 1

## 2019-01-19 MED ORDER — ALUM & MAG HYDROXIDE-SIMETH 200-200-20 MG/5ML PO SUSP: 30.0000 mL | ORAL | Status: DC | PRN

## 2019-01-19 MED ORDER — LORATADINE 10 MG PO TABS
10.0000 mg | ORAL_TABLET | Freq: Every day | ORAL | Status: DC
  Administered 2019-01-19 – 2019-01-21 (×3): 10 mg via ORAL
  Filled 2019-01-19 (×5): qty 1

## 2019-01-19 NOTE — [facility] Suicide Risk Assessment (Signed)
[facility] INPATIENT:  Family/Significant Other Suicide Prevention Education  Suicide Prevention Education:  Contact Attempts: Cleophus Molt (pt's friend) [phone] has been identified by the patient as the family member/significant other with whom the patient will be residing, and identified as the person(s) who will aid the patient in the event of a mental health crisis.  With written consent from the patient, two attempts were made to provide suicide prevention education, prior to and/or following the patient's discharge.  We were unsuccessful in providing suicide prevention education.  A suicide education pamphlet was given to the patient to share with family/significant other.  Date and time of first attempt: 01/19/2019 at 3:37PM (another friend answered the phone. Arline Asp not available at this time and CSW did not have consent to speak with this person. CSW stated that a social worker would likely call back on Monday to speak with Arline Asp).  Date and time of second attempt: TO BE DONE.  Rona Ravens LCSW 01/19/2019, 3:39 PM

## 2019-01-19 NOTE — Progress Notes (Signed)
Date:  01/19/2019  Time:  1300  Type of Therapy:  Nurse Education/Shalita, RN  Participation Level:  Did not attend 
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
## 2019-01-19 NOTE — Tx Team (Signed)
Initial Treatment Plan 01/19/2019 1:37 AM Kla Wendi Maya ZOX:[id number]    PATIENT STRESSORS: Other: "relationship problems"    PATIENT STRENGTHS: Average or above average intelligence Capable of independent living Communication skills Financial means General fund of knowledge Motivation for treatment/growth Physical Health Special hobby/interest Supportive family/friends   PATIENT IDENTIFIED PROBLEMS: "trying to handle my panic attacks better"   "communicate more with people when I'm feeling sad"    SI  depression  anxiety           DISCHARGE CRITERIA:  Improved stabilization in mood, thinking, and/or behavior  PRELIMINARY DISCHARGE PLAN: Attend aftercare/continuing care group  PATIENT/FAMILY INVOLVEMENT: This treatment plan has been presented to and reviewed with the patient, Cheyenne Schwartz.  The patient and family have been given the opportunity to ask questions and make suggestions.  Arrie Aran, [place] 01/19/2019, 1:37 AM
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
## 2019-01-19 NOTE — Progress Notes (Signed)
Patient ID: Cheyenne Schwartz, female   DOB: 1996-09-21, 23 y.o.   MRN: [id number]   D: Pt has been bright and appropriate on the unit today. Pt did not attend any groups nor did she engage in treatment, she has no insight and was only focused on discharge. Pt reported that her depression was a 3, her anxiety was a 0, and her hopelessness was a 0. Pt reported that her goal for today was to put self first. Pt also requested to be put back on her Zoloft, new orders noted pt will get Zoloft 100mg  at bedtime. Pt reported being negative SI/HI, no AH/VH noted. A: 15 min checks continued for patient safety. R: Pt safety maintained.

## 2019-01-19 NOTE — Progress Notes (Signed)
Patient has been observed up in the dayroom laughing and talking with peers. She reports having had a good day with her roommates and friends visiting her tonight. She has been working on her coping skills to help with school stress. Support given and safety maintained on unit with 15 min checks.

## 2019-01-19 NOTE — BHH Group Notes (Signed)
B[facility] LCSW Group Therapy Note  01/19/2019   10:00-11:00AM  Type of Therapy and Topic:  Group Therapy:  Unhealthy versus Healthy Supports, Which Am I?  Participation Level:  Active   Description of Group:  Patients in this group were introduced to the concept that additional supports including self-support are an essential part of recovery.  Initially a discussion was held about the differences between healthy versus unhealthy supports.  Patients were asked to share what unhealthy supports in their lives need to be addressed, as well as what additional healthy supports could be added for greater help in reaching their goals.   A song entitled "My Own Hero" was played and a group discussion ensued in which patients stated they could relate to the song and it inspired them to realize they have be willing to help themselves in order to succeed, because other people cannot achieve sobriety or stability for them.  We discussed adding a variety of healthy supports to address the various needs in patient lives, including becoming more self-supportive.  Therapeutic Goals: 1)  Highlight the differences between healthy and unhealthy supports 2)  Suggest the importance of being a part of one's own support system 2)  Discuss reasons people in one's life may eventually be unable to be continually supportive  3)  Identify the patient's current support system and   4)  elicit commitments to add healthy supports and to become more conscious of being self-supportive   Summary of Patient Progress:  The patient expressed that the unhealthy support which needs to be addressed includes toxic people in her life including ex-girlfriend that she thought was truly supportive until she recently discovered otherwise, her parents who don't understand mental health issues although they are health professionals themselves, and social media which is a trigger for her.  Healthy supports which could be added for increased stability  and happiness include relying more on her best friends, her brother, her therapist, and herself.  She can increasingly use coping skills that have worked for her such as the Calm app on her phone for meditation and music, breathing techniques, and setting boundaries with social media and her parents.  Therapeutic Modalities:   Motivational Interviewing Activity  Lynnell Chad

## 2019-01-19 NOTE — H&P (Addendum)
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   She was voluntarily admitted to the unit following  SI with plan to overdose.  Chief Compliant::" I just became really overwhelmed and had thoughts of suicide."  HPI: This is a  23 year old AA single female who was admitted to the unit following suicidal ideation. Cheyenne Schwartz this evaluation, patient denied plan or intent although per chart review, patient endorses a plan to overdose. Patient tearful at times during this evaluation  and minimizes her suicidal thoughts. She admits that she has been feeling depressed and and describes neuro-vegetative symptoms as below- hypersomnia, " stress eating", mild anhedonia, decreased sense of self esteem.Reports depression has worsened recently and reports stressors as relationship/recent break up. She endorses a history of anxiety deciding some panic attacks and some agoraphobia, which she states initially  started after witness her sister having seizures.She denies history of suicide attempts or of self cutting, psychotic symptoms, or trauma related disorders. Denies alcohol or drug abuse. Reports she is currently prescribed Zoloft 100 mg and thus far, the medication has been working well. Reports she has [facility] however, she would like to receive resources for additional outpatient therapy. She denies family history of  mental illness. Has a medical history remarkable for Asthma, which she states has tended to improve recently,  Exzema. NKDA. Denies use of  tobacco product.   Associated Signs/Symptoms: Depression Symptoms:  depressed mood, anhedonia, hypersomnia, suicidal thoughts without plan, anxiety, stress eating  (Hypo) Manic Symptoms:  none  Anxiety Symptoms:  Excessive Worry, Psychotic Symptoms:  none PTSD Symptoms: NA Total Time spent with patient: 45 minutes  Past Psychiatric History: Patient currently being seen at [facility] for the past 2 years. She is on Zoloft for depression.  Is the patient at risk to self? Yes.    Has the patient been a risk to self in the past 6 months? No.  Has the patient been a risk to self within the distant past? No.  Is the patient a risk to others? No.  Has the patient been a risk to others in the past 6 months? No.  Has the patient been a risk to others within the distant past? No.    Alcohol Screening: 1. How often do you have a drink containing alcohol?: Never 2. How many drinks containing alcohol do you have on a typical day when you are drinking?: 1 or 2 3. How often do you have six or more drinks on one occasion?: Never AUDIT-C Score: 0 9. Have you or someone else been injured as a result of your drinking?: No 10. Has a relative or friend or a doctor or another health worker been concerned about your drinking or suggested you cut down?: No Alcohol Use Disorder Identification Test Final Score (AUDIT): 0 Alcohol Brief Interventions/Follow-up: AUDIT Score <7 follow-up not indicated Substance Abuse History in the last 12 months:  No. Consequences of Substance Abuse: NA Previous Psychotropic Medications: Yes  Psychological Evaluations: No  Past Medical History:  Past Medical History:  Diagnosis Date  . Anxiety   .  Asthma   . Eczema    History reviewed. No pertinent surgical history. Family History: History reviewed. No pertinent family history. Family Psychiatric  History: Denies.  Tobacco Screening: Have you used any form of tobacco in  the last 30 days? (Cigarettes, Smokeless Tobacco, Cigars, and/or Pipes): No Social History:  Social History   Substance and Sexual Activity  Alcohol Use Not Currently  . Frequency: Never     Social History   Substance and Sexual Activity  Drug Use Never    Additional Social History: Marital status: Single("I've dealt with alot of heartbreak since coming to [employer]. I had to break up with my girlfriend and it was really hard for me. I have a hard time communicating about issues in our relationship and issues with me and my anxiety." ) Are you sexually active?: Yes What is your sexual orientation?: bisexual Has your sexual activity been affected by drugs, alcohol, medication, or emotional stress?: no. Does patient have children?: No    Pain Medications: denies Prescriptions: denies Over the Counter: denies History of alcohol / drug use?: No history of alcohol / drug abuse      Allergies:   Allergies  Allergen Reactions  . Apple Itching    Itchy throat   . Chocolate Flavor Hives and Itching  . Fish Allergy Itching  . Banana Itching    Itchy Throat  . Bee Pollen   . Latex Hives  . Peanut Oil Itching    Itchy Throat  . Pollen Extract   . Shellfish Allergy Itching    Itchy throat   Lab Results:  Results for orders placed or performed during the hospital encounter of 01/19/19 (from the past 48 hour(s))  Hemoglobin A1c     Status: Abnormal   Collection Time: 01/19/19  6:17 AM  Result Value Ref Range   Hgb A1c MFr Bld 4.7 (L) 4.8 - 5.6 %    Comment: (NOTE) Pre diabetes:          5.7%-6.4% Diabetes:              >6.4% Glycemic control for   <7.0% adults with diabetes    Mean Plasma Glucose 88.19 mg/dL    Comment: Performed at [facility] [address] [place], [place] [address]  Lipid panel     Status: Abnormal   Collection Time: 01/19/19  6:17 AM  Result Value Ref Range   Cholesterol 260 (H) 0 - 200 mg/dL   Triglycerides 61 <150 mg/dL   HDL 77 >40 mg/dL    Total CHOL/HDL Ratio 3.4 RATIO   VLDL 12 0 - 40 mg/dL   LDL Cholesterol 171 (H) 0 - 99 mg/dL    Comment:        Total Cholesterol/HDL:CHD Risk Coronary Heart Disease Risk Table                     Men   Women  1/2 Average Risk   3.4   3.3  Average Risk       5.0   4.4  2 X Average Risk   9.6   7.1  3 X Average Risk  23.4   11.0        Use the calculated Patient Ratio above and the CHD Risk Table to determine the patient's CHD Risk.        ATP III CLASSIFICATION (LDL):  <100     mg/dL   Optimal  100-129  mg/dL   Near  or Above                    Optimal  130-159  mg/dL   Borderline  160-189  mg/dL   High  >190     mg/dL   Very High Performed at [facility] [address] [place], [place] [address]   TSH     Status: None   Collection Time: 01/19/19  6:17 AM  Result Value Ref Range   TSH 1.197 0.350 - 4.500 uIU/mL    Comment: Performed by a 3rd Generation assay with a functional sensitivity of <=0.01 uIU/mL. Performed at [facility], [place] [address], [place], [place] [address]     Blood Alcohol level:  Lab Results  Component Value Date   ETH <10 15/40/0867    Metabolic Disorder Labs:  Lab Results  Component Value Date   HGBA1C 4.7 (L) 01/19/2019   MPG 88.19 01/19/2019   No results found for: PROLACTIN Lab Results  Component Value Date   CHOL 260 (H) 01/19/2019   TRIG 61 01/19/2019   HDL 77 01/19/2019   CHOLHDL 3.4 01/19/2019   VLDL 12 01/19/2019   LDLCALC 171 (H) 01/19/2019    Current Medications: Current Facility-Administered Medications  Medication Dose Route Frequency Provider Last Rate Last Dose  . acetaminophen (TYLENOL) tablet 650 mg  650 mg Oral Q6H PRN Lindon Romp A, NP      . albuterol (PROVENTIL HFA;VENTOLIN HFA) 108 (90 Base) MCG/ACT inhaler 2 puff  2 puff Inhalation Q6H PRN Lindon Romp A, NP      . alum & mag hydroxide-simeth (MAALOX/MYLANTA) 200-200-20 MG/5ML suspension 30 mL  30 mL Oral Q4H PRN  Lindon Romp A, NP      . fluticasone (FLONASE) 50 MCG/ACT nasal spray 2 spray  2 spray Each Nare Daily PRN Lindon Romp A, NP      . fluticasone (FLOVENT HFA) 110 MCG/ACT inhaler 2 puff  2 puff Inhalation BID Lindon Romp A, NP   2 puff at 01/19/19 0827  . hydrOXYzine (ATARAX/VISTARIL) tablet 25-50 mg  25-50 mg Oral Q8H PRN Lindon Romp A, NP   25 mg at 01/19/19 0826  . loratadine (CLARITIN) tablet 10 mg  10 mg Oral Daily Lindon Romp A, NP   10 mg at 01/19/19 6195  . magnesium hydroxide (MILK OF MAGNESIA) suspension 30 mL  30 mL Oral Daily PRN Lindon Romp A, NP      . montelukast (SINGULAIR) tablet 10 mg  10 mg Oral Daily Lindon Romp A, NP   10 mg at 01/19/19 0826  . pantoprazole (PROTONIX) EC tablet 20 mg  20 mg Oral Daily Lindon Romp A, NP   20 mg at 01/19/19 0932  . sertraline (ZOLOFT) tablet 100 mg  100 mg Oral QHS Shamya Macfadden, Myer Peer, MD       PTA Medications: Medications Prior to Admission  Medication Sig Dispense Refill Last Dose  . cetirizine (ZYRTEC ALLERGY) 10 MG tablet Take 10 mg by mouth daily.   01/17/2019 at Unknown time  . Ferrous Sulfate (IRON PO) Take 1 tablet by mouth daily.   Past Month at Unknown time  . FLOVENT HFA 110 MCG/ACT inhaler Inhale 2 puffs into the lungs 2 (two) times daily.   01/17/2019 at Unknown time  . fluticasone (FLONASE) 50 MCG/ACT nasal spray Place 2 sprays into both nostrils daily as needed for rhinitis.    Past Month at Unknown time  . hydrOXYzine (ATARAX/VISTARIL) 25 MG  tablet Take 25-50 mg by mouth every 8 (eight) hours as needed for itching.   1 01/17/2019 at Unknown time  . LORazepam (ATIVAN) 0.5 MG tablet Take 1 tablet (0.5 mg total) by mouth every 12 (twelve) hours as needed for anxiety. 10 tablet 0 UNK  . montelukast (SINGULAIR) 10 MG tablet Take 10 mg by mouth daily.  12 01/17/2019 at Unknown time  . pantoprazole (PROTONIX) 20 MG tablet Take 1 tablet (20 mg total) by mouth daily. 14 tablet 0 Past Week at Unknown time  . PROAIR RESPICLICK 179 (90  Base) MCG/ACT AEPB Inhale 2 puffs into the lungs every 6 (six) hours as needed (SOB, wheezing).    Past Week at Unknown time  . sertraline (ZOLOFT) 100 MG tablet Take 100 mg by mouth daily.   01/17/2019 at Unknown time  . triamcinolone ointment (KENALOG) 0.1 % Apply 1 application topically daily as needed.   Past Month at Unknown time    Musculoskeletal: Strength & Muscle Tone: within normal limits Gait & Station: normal Patient leans: N/A  Psychiatric Specialty Exam: Physical Exam  Nursing note and vitals reviewed.   Review of Systems  Psychiatric/Behavioral: Positive for depression and suicidal ideas. Negative for hallucinations, memory loss and substance abuse. The patient is nervous/anxious. The patient does not have insomnia.   All other systems reviewed and are negative.   Blood pressure (!) 123/97, pulse 99, temperature 98.4 F (36.9 C), temperature source Oral, resp. rate 20, height _0  (1.651 m), weight 72.6 kg, last menstrual period 01/12/2019.Body mass index is 26.63 kg/m.  General Appearance: Fairly Groomed  Eye Contact:  Good  Speech:  Clear and Coherent and Normal Rate  Volume:  Normal  Mood:  Anxious and Depressed  Affect:  Congruent and Tearful  Thought Process:  Coherent, Goal Directed, Linear and Descriptions of Associations: Intact  Orientation:  Full (Time, Place, and Person)  Thought Content:  Logical no hallucinations,no delusions   Suicidal Thoughts:  No denies at this time. Prior to admission endorsed SI with questionable plan   Homicidal Thoughts:  No  Memory:  Immediate;   Fair Recent;   Fair  Judgement:  Fair  Insight:  Shallow  Psychomotor Activity:  Normal  Concentration:  Concentration: Fair and Attention Span: Fair  Recall:  [employer] of Knowledge:  Fair  Language:  Good  Akathisia:  Negative  Handed:  Right  AIMS (if indicated):     Assets:  Communication Skills Desire for Improvement Resilience Social Support  ADL's:  Intact   Cognition:  WNL  Sleep:  Number of Hours: 2.5    Treatment Plan Summary: Daily contact with patient to assess and evaluate symptoms and progress in treatment  Treatment Plan/Recommendations: 1. Admit for crisis management and stabilization, estimated length of stay 3-5 days.  2. Medication management to reduce current symptoms to base line and improve the patient's overall level of functioning:Contnued Zoloft 100 mg po daily at bedtime. Reports Zoloft has been helpful and prefers to continue this antidepressant trial at present.? 3. Treat health problems as indicated.  4. Develop treatment plan to decrease risk of relapse upon discharge and the need for readmission.  5. Psycho-social education regarding relapse prevention and self care.  6. Health care follow up as needed for medical problems.  7. Review, reconcile, and reinstate any pertinent home medications for other health issues where appropriate. 8. Call for consults with hospitalist for any additional specialty patient care services as needed. 9. Labs:HgbA1c 4.7, lipid  panel showed cholesterol of 260 and LDL of 171. TSH normal 1.197. Pregnancy and UDS negative. CBC showed mild anemia which patient admits to history of anemia.    Physician Treatment Plan for Primary Diagnosis: Severe recurrent major depression without psychotic features ([place]) Long Term Goal(s): Improvement in symptoms so as ready for discharge  Short Term Goals: Ability to disclose and discuss suicidal ideas, Ability to demonstrate self-control will improve, Ability to identify and develop effective coping behaviors will improve and Compliance with prescribed medications will improve  Physician Treatment Plan for Secondary Diagnosis: Principal Problem:   Severe recurrent major depression without psychotic features ([place])  Long Term Goal(s): Improvement in symptoms so as ready for discharge  Short Term Goals: Ability to verbalize feelings will improve, Ability to  disclose and discuss suicidal ideas, Ability to demonstrate self-control will improve, Ability to identify and develop effective coping behaviors will improve, Ability to maintain clinical measurements within normal limits will improve and Compliance with prescribed medications will improve  I certify that inpatient services furnished can reasonably be expected to improve the patient's condition.    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 Patient reports she has been feeling depressed, and had been experiencing suicidal ideations, which she describes as passive. On day of admission she called a hotline number and came to ED voluntarily. Reports depression has worsened recently in the context of relationship stressors/ recent break up. She also describes increased anxiety Endorses neuro-vegetative symptoms as below- hypersomnia, " stress eating", mild anhedonia, decreased sense of self esteem. Denies psychosis.   No prior psychiatric admissions . Denies history of suicide attempts or of self cutting . [facility]. Was recently started on Zoloft ( started two months ago ) for depression and anxiety, and states she feels it has helped partially, without side effects. Denies history of psychosis or of mania. Describes history of anxiety, describes history of panic attacks and some agoraphobia, which she states were originally triggered by her sister having seizures.  History of Asthma, which she states has tended to improve recently, Exzema. NKDA. Does not smoke or use tobacco product. Denies alcohol or drug abuse. Parents live together, live in [place], has 3 siblings . Denies history of mental illness in the family. One of her sisters has history of seizures, which  has been a stressor for patient, as she has witnessed several of these .  Dx- MDD, consider Panic Disorder.   Plan - Inpatient admission, medications as below Reports Zoloft has been helpful and prefers to continue this antidepressant trial at present. Continue Zoloft 100 mgrs QDAY .

## 2019-01-20 NOTE — Progress Notes (Signed)
[facility] MD Progress Note  01/20/2019 11:22 AM Cheyenne Schwartz  MRN:  [id number] Subjective: patient reports she is feeling better than she did on admission. Denies medication side effects. Objective: I have discussed case with treatment team and have met with patient. 23 year old single female, college student, presented to hospital after she called a suicide hotline. Reports  depression, passive suicidal ideations, neuro-vegetative symptoms . No prior psychiatric admissions , no history of suicidal attempts. Had been started on Zoloft a few weeks ago. Today reports she is feeling better, and presents with improving mood and a more reactive affect . Visible in day room, noted to be interactive with peers, brighter during interactions  Denies suicidal ideations.  Denies medication side effects.  TSH 4.7 , HgbA1C 1.197, Lipid Panel-  Cholesterol 260 Principal Problem: Severe recurrent major depression without psychotic features ([place]) Diagnosis: Principal Problem:   Severe recurrent major depression without psychotic features ([place])  Total Time spent with patient: 20 minutes  Past Psychiatric History:   Past Medical History:  Past Medical History:  Diagnosis Date  . Anxiety   . Asthma   . Eczema    History reviewed. No pertinent surgical history. Family History: History reviewed. No pertinent family history. Family Psychiatric  History: Social History:  Social History   Substance and Sexual Activity  Alcohol Use Not Currently  . Frequency: Never     Social History   Substance and Sexual Activity  Drug Use Never    Social History   Socioeconomic History  . Marital status: Single    Spouse name: Not on file  . Number of children: Not on file  . Years of education: Not on file  . Highest education level: Not on file  Occupational History  . Not on file  Social Needs  . Financial resource strain: Not on file  . Food insecurity:    Worry: Not on file    Inability: Not on file   . Transportation needs:    Medical: Not on file    Non-medical: Not on file  Tobacco Use  . Smoking status: Never Smoker  . Smokeless tobacco: Never Used  Substance and Sexual Activity  . Alcohol use: Not Currently    Frequency: Never  . Drug use: Never  . Sexual activity: Not Currently  Lifestyle  . Physical activity:    Days per week: Not on file    Minutes per session: Not on file  . Stress: Not on file  Relationships  . Social connections:    Talks on phone: Not on file    Gets together: Not on file    Attends religious service: Not on file    Active member of club or organization: Not on file    Attends meetings of clubs or organizations: Not on file    Relationship status: Not on file  Other Topics Concern  . Not on file  Social History Narrative  . Not on file   Additional Social History:    Pain Medications: denies Prescriptions: denies Over the Counter: denies History of alcohol / drug use?: No history of alcohol / drug abuse  Sleep: Good  Appetite:  Good  Current Medications: Current Facility-Administered Medications  Medication Dose Route Frequency Provider Last Rate Last Dose  . acetaminophen (TYLENOL) tablet 650 mg  650 mg Oral Q6H PRN Lindon Romp A, NP      . albuterol (PROVENTIL HFA;VENTOLIN HFA) 108 (90 Base) MCG/ACT inhaler 2 puff  2 puff Inhalation Q6H  PRN Berry, Jason A, NP      . alum & mag hydroxide-simeth (MAALOX/MYLANTA) 200-200-20 MG/5ML suspension 30 mL  30 mL Oral Q4H PRN Berry, Jason A, NP      . fluticasone (FLONASE) 50 MCG/ACT nasal spray 2 spray  2 spray Each Nare Daily PRN Berry, Jason A, NP      . fluticasone (FLOVENT HFA) 110 MCG/ACT inhaler 2 puff  2 puff Inhalation BID Berry, Jason A, NP   2 puff at 01/20/19 0744  . hydrOXYzine (ATARAX/VISTARIL) tablet 25-50 mg  25-50 mg Oral Q8H PRN Berry, Jason A, NP   25 mg at 01/19/19 0826  . loratadine (CLARITIN) tablet 10 mg  10 mg Oral Daily Berry, Jason A, NP   10 mg at 01/20/19 0744  .  magnesium hydroxide (MILK OF MAGNESIA) suspension 30 mL  30 mL Oral Daily PRN Berry, Jason A, NP      . montelukast (SINGULAIR) tablet 10 mg  10 mg Oral Daily Berry, Jason A, NP   10 mg at 01/20/19 0744  . pantoprazole (PROTONIX) EC tablet 20 mg  20 mg Oral Daily Berry, Jason A, NP   20 mg at 01/20/19 0744  . sertraline (ZOLOFT) tablet 100 mg  100 mg Oral QHS Cobos, Fernando A, MD   100 mg at 01/19/19 2139    Lab Results:  Results for orders placed or performed during the hospital encounter of 01/19/19 (from the past 48 hour(s))  Hemoglobin A1c     Status: Abnormal   Collection Time: 01/19/19  6:17 AM  Result Value Ref Range   Hgb A1c MFr Bld 4.7 (L) 4.8 - 5.6 %    Comment: (NOTE) Pre diabetes:          5.7%-6.4% Diabetes:              >6.4% Glycemic control for   <7.0% adults with diabetes    Mean Plasma Glucose 88.19 mg/dL    Comment: Performed at [facility], [address] [place], [place] [address]  Lipid panel     Status: Abnormal   Collection Time: 01/19/19  6:17 AM  Result Value Ref Range   Cholesterol 260 (H) 0 - 200 mg/dL   Triglycerides 61 <150 mg/dL   HDL 77 >40 mg/dL   Total CHOL/HDL Ratio 3.4 RATIO   VLDL 12 0 - 40 mg/dL   LDL Cholesterol 171 (H) 0 - 99 mg/dL    Comment:        Total Cholesterol/HDL:CHD Risk Coronary Heart Disease Risk Table                     Men   Women  1/2 Average Risk   3.4   3.3  Average Risk       5.0   4.4  2 X Average Risk   9.6   7.1  3 X Average Risk  23.4   11.0        Use the calculated Patient Ratio above and the CHD Risk Table to determine the patient's CHD Risk.        ATP III CLASSIFICATION (LDL):  <100     mg/dL   Optimal  100-129  mg/dL   Near or Above                    Optimal  130-159  mg/dL   Borderline  160-189  mg/dL   High  >190       mg/dL   Very High Performed at [facility] [address] [place], [place] [address]   TSH     Status: None   Collection Time: 01/19/19  6:17 AM   Result Value Ref Range   TSH 1.197 0.350 - 4.500 uIU/mL    Comment: Performed by a 3rd Generation assay with a functional sensitivity of <=0.01 uIU/mL. Performed at [facility], [place] [address], [place], [place] [address]     Blood Alcohol level:  Lab Results  Component Value Date   ETH <10 35/32/9924    Metabolic Disorder Labs: Lab Results  Component Value Date   HGBA1C 4.7 (L) 01/19/2019   MPG 88.19 01/19/2019   No results found for: PROLACTIN Lab Results  Component Value Date   CHOL 260 (H) 01/19/2019   TRIG 61 01/19/2019   HDL 77 01/19/2019   CHOLHDL 3.4 01/19/2019   VLDL 12 01/19/2019   LDLCALC 171 (H) 01/19/2019    Physical Findings: AIMS: Facial and Oral Movements Muscles of Facial Expression: None, normal Lips and Perioral Area: None, normal Jaw: None, normal Tongue: None, normal,Extremity Movements Upper (arms, wrists, hands, fingers): None, normal Lower (legs, knees, ankles, toes): None, normal, Trunk Movements Neck, shoulders, hips: None, normal, Overall Severity Severity of abnormal movements (highest score from questions above): None, normal Incapacitation due to abnormal movements: None, normal Patient's awareness of abnormal movements (rate only patient's report): No Awareness, Dental Status Current problems with teeth and/or dentures?: No Does patient usually wear dentures?: No  CIWA:  CIWA-Ar Total: 0 COWS:  COWS Total Score: 1  Musculoskeletal: Strength & Muscle Tone: within normal limits Gait & Station: normal Patient leans: N/A  Psychiatric Specialty Exam: Physical Exam  ROS no headache, no chest pain, no shortness of breath, no vomiting   Blood pressure (!) 120/95, pulse 88, temperature (!) 97.4 F (36.3 C), temperature source Oral, resp. rate 16, height 5' 5" (1.651 m), weight 72.6 kg, last menstrual period 01/12/2019.Body mass index is 26.63 kg/m.  General Appearance: Well Groomed  Eye Contact:  Good  Speech:   Normal Rate  Volume:  Normal  Mood:  describes improving mood and states her mood is better than on admission  Affect:  Appropriate and more reactive  Thought Process:  Linear and Descriptions of Associations: Intact  Orientation:  Full (Time, Place, and Person)  Thought Content:  no hallucinations, no delusions, not internally preoccupied  Suicidal Thoughts:  No denies suicidal or self injurious ideations   Homicidal Thoughts:  No denies homicidal or violent ideations  Memory:  recent and remote grossly intact   Judgement:  Fair/ improving   Insight:  improving   Psychomotor Activity:  Normal  Concentration:  Concentration: Good and Attention Span: Good  Recall:  Good  Fund of Knowledge:  Good  Language:  Good  Akathisia:  Negative  Handed:  Right  AIMS (if indicated):     Assets:  Communication Skills Desire for Improvement Resilience  ADL's:  Intact  Cognition:  WNL  Sleep:  Number of Hours: 6.75   Assessment - 23 year old single female, college student, presented to hospital after she called a suicide hotline. Reports  depression, passive suicidal ideations, neuro-vegetative symptoms . No prior psychiatric admissions , no history of suicidal attempts. Had been started on Zoloft a few weeks ago.  Patient reports feeling better, less depressed , and presents with a more reactive affect. Denies suicidal ideations today. Denies medication side effects Treatment Plan Summary:  Daily contact with patient to assess and evaluate symptoms and progress in treatment, Medication management, Plan inpatient treatment  and medications as below Encourage group and milieu participation to work on coping skills and symptom reduction Continue Zoloft 100 mgrs QDAY for depression, anxiety  Continue Vistaril 25 mgrs Q 6 hours PRN for anxiety Continue Protonix for GERD symptoms Treatment team working on disposition planning options Jenne Campus, MD 01/20/2019, 11:22 AM

## 2019-01-20 NOTE — Progress Notes (Signed)
Patient ID: Cheyenne Schwartz, female   DOB: 11-25-1996, 23 y.o.   MRN: [id number]  Nursing Progress Note [id number]  On initial approach, patient is seen up in her room after showering. Patient presents mildly anxious but is pleasant/appropriate during interactions. Patient reports she went to breakfast this morning. Patient compliant with scheduled medications and denies need for PRNs. Patient is seen engaging with peers in the milieu. Patient currently denies SI/HI/AVH.   Patient is educated about and provided medication per provider's orders. Patient safety maintained with q15 min safety checks and low fall risk precautions. Emotional support given, 1:1 interaction, and active listening provided. Patient encouraged to attend meals, groups, and work on treatment plan and goals. Labs, vital signs and patient behavior monitored throughout shift.   Patient contracts for safety with staff. Patient remains safe on the unit at this time and agrees to come to staff with any issues/concerns. Patient is interacting with peers appropriately on the unit. Will continue to support and monitor.   Patient's self-inventory sheet Rated Energy Level  Good  Rated Sleep  Good  Rated Appetite  Good  Rated Anxiety (0-10)  0  Rated Hopelessness (0-10)  0  Rated Depression (0-10)  0  Daily Goal  "talk with my social worker about my discharge plan and work on out-patient resources for me"  Any Additional Comments:

## 2019-01-20 NOTE — Tx Team (Signed)
Interdisciplinary Treatment and Diagnostic Plan Update  01/20/2019 Time of Session:  Cheyenne Schwartz MRN: [id number]  Principal Diagnosis: Severe recurrent major depression without psychotic features [facility](HCC)  Secondary Diagnoses: Principal Problem:   Severe recurrent major depression without psychotic features (HCC)   Current Medications:  Current Facility-Administered Medications  Medication Dose Route Frequency Provider Last Rate Last Dose  . acetaminophen (TYLENOL) tablet 650 mg  650 mg Oral Q6H PRN Nira ConnBerry, Jason A, NP      . albuterol (PROVENTIL HFA;VENTOLIN HFA) 108 (90 Base) MCG/ACT inhaler 2 puff  2 puff Inhalation Q6H PRN Nira ConnBerry, Jason A, NP      . alum & mag hydroxide-simeth (MAALOX/MYLANTA) 200-200-20 MG/5ML suspension 30 mL  30 mL Oral Q4H PRN Nira ConnBerry, Jason A, NP      . fluticasone (FLONASE) 50 MCG/ACT nasal spray 2 spray  2 spray Each Nare Daily PRN Nira ConnBerry, Jason A, NP      . fluticasone (FLOVENT HFA) 110 MCG/ACT inhaler 2 puff  2 puff Inhalation BID Nira ConnBerry, Jason A, NP   2 puff at 01/20/19 0744  . hydrOXYzine (ATARAX/VISTARIL) tablet 25-50 mg  25-50 mg Oral Q8H PRN Nira ConnBerry, Jason A, NP   25 mg at 01/19/19 0826  . loratadine (CLARITIN) tablet 10 mg  10 mg Oral Daily Nira ConnBerry, Jason A, NP   10 mg at 01/20/19 0744  . magnesium hydroxide (MILK OF MAGNESIA) suspension 30 mL  30 mL Oral Daily PRN Nira ConnBerry, Jason A, NP      . montelukast (SINGULAIR) tablet 10 mg  10 mg Oral Daily Nira ConnBerry, Jason A, NP   10 mg at 01/20/19 0744  . pantoprazole (PROTONIX) EC tablet 20 mg  20 mg Oral Daily Nira ConnBerry, Jason A, NP   20 mg at 01/20/19 0744  . sertraline (ZOLOFT) tablet 100 mg  100 mg Oral QHS Cobos, Rockey SituFernando A, MD   100 mg at 01/19/19 2139   PTA Medications: Medications Prior to Admission  Medication Sig Dispense Refill Last Dose  . cetirizine (ZYRTEC ALLERGY) 10 MG tablet Take 10 mg by mouth daily.   01/17/2019 at Unknown time  . Ferrous Sulfate (IRON PO) Take 1 tablet by mouth daily.   Past Month at Unknown  time  . FLOVENT HFA 110 MCG/ACT inhaler Inhale 2 puffs into the lungs 2 (two) times daily.   01/17/2019 at Unknown time  . fluticasone (FLONASE) 50 MCG/ACT nasal spray Place 2 sprays into both nostrils daily as needed for rhinitis.    Past Month at Unknown time  . hydrOXYzine (ATARAX/VISTARIL) 25 MG tablet Take 25-50 mg by mouth every 8 (eight) hours as needed for itching.   1 01/17/2019 at Unknown time  . LORazepam (ATIVAN) 0.5 MG tablet Take 1 tablet (0.5 mg total) by mouth every 12 (twelve) hours as needed for anxiety. 10 tablet 0 UNK  . montelukast (SINGULAIR) 10 MG tablet Take 10 mg by mouth daily.  12 01/17/2019 at Unknown time  . pantoprazole (PROTONIX) 20 MG tablet Take 1 tablet (20 mg total) by mouth daily. 14 tablet 0 Past Week at Unknown time  . PROAIR RESPICLICK 108 (90 Base) MCG/ACT AEPB Inhale 2 puffs into the lungs every 6 (six) hours as needed (SOB, wheezing).    Past Week at Unknown time  . sertraline (ZOLOFT) 100 MG tablet Take 100 mg by mouth daily.   01/17/2019 at Unknown time  . triamcinolone ointment (KENALOG) 0.1 % Apply 1 application topically daily as needed.   Past Month at Unknown time  Patient Stressors: Other: "relationship problems"   Patient Strengths: Average or above average intelligence Capable of independent living Communication skills Financial means General fund of knowledge Motivation for treatment/growth Physical Health Special hobby/interest Supportive family/friends  Treatment Modalities: Medication Management, Group therapy, Case management,  1 to 1 session with clinician, Psychoeducation, Recreational therapy.   Physician Treatment Plan for Primary Diagnosis: Severe recurrent major depression without psychotic features (HCC) Long Term Goal(s): Improvement in symptoms so as ready for discharge Improvement in symptoms so as ready for discharge   Short Term Goals: Ability to disclose and discuss suicidal ideas Ability to demonstrate self-control  will improve Ability to identify and develop effective coping behaviors will improve Compliance with prescribed medications will improve Ability to verbalize feelings will improve Ability to disclose and discuss suicidal ideas Ability to demonstrate self-control will improve Ability to identify and develop effective coping behaviors will improve Ability to maintain clinical measurements within normal limits will improve Compliance with prescribed medications will improve  Medication Management: Evaluate patient's response, side effects, and tolerance of medication regimen.  Therapeutic Interventions: 1 to 1 sessions, Unit Group sessions and Medication administration.  Evaluation of Outcomes: Progressing  Physician Treatment Plan for Secondary Diagnosis: Principal Problem:   Severe recurrent major depression without psychotic features (HCC)  Long Term Goal(s): Improvement in symptoms so as ready for discharge Improvement in symptoms so as ready for discharge   Short Term Goals: Ability to disclose and discuss suicidal ideas Ability to demonstrate self-control will improve Ability to identify and develop effective coping behaviors will improve Compliance with prescribed medications will improve Ability to verbalize feelings will improve Ability to disclose and discuss suicidal ideas Ability to demonstrate self-control will improve Ability to identify and develop effective coping behaviors will improve Ability to maintain clinical measurements within normal limits will improve Compliance with prescribed medications will improve     Medication Management: Evaluate patient's response, side effects, and tolerance of medication regimen.  Therapeutic Interventions: 1 to 1 sessions, Unit Group sessions and Medication administration.  Evaluation of Outcomes: Progressing   RN Treatment Plan for Primary Diagnosis: Severe recurrent major depression without psychotic features (HCC) Long  Term Goal(s): Knowledge of disease and therapeutic regimen to maintain health will improve  Short Term Goals: Ability to participate in decision making will improve, Ability to verbalize feelings will improve, Ability to disclose and discuss suicidal ideas, Ability to identify and develop effective coping behaviors will improve and Compliance with prescribed medications will improve  Medication Management: RN will administer medications as ordered by provider, will assess and evaluate patient's response and provide education to patient for prescribed medication. RN will report any adverse and/or side effects to prescribing provider.  Therapeutic Interventions: 1 on 1 counseling sessions, Psychoeducation, Medication administration, Evaluate responses to treatment, Monitor vital signs and CBGs as ordered, Perform/monitor CIWA, COWS, AIMS and Fall Risk screenings as ordered, Perform wound care treatments as ordered.  Evaluation of Outcomes: Progressing   LCSW Treatment Plan for Primary Diagnosis: Severe recurrent major depression without psychotic features (HCC) Long Term Goal(s): Safe transition to appropriate next level of care at discharge, Engage patient in therapeutic group addressing interpersonal concerns.  Short Term Goals: Engage patient in aftercare planning with referrals and resources  Therapeutic Interventions: Assess for all discharge needs, 1 to 1 time with Social worker, Explore available resources and support systems, Assess for adequacy in community support network, Educate family and significant other(s) on suicide prevention, Complete Psychosocial Assessment, Interpersonal group therapy.  Evaluation of Outcomes:  Progressing   Progress in Treatment: Attending groups: Yes. Participating in groups: Yes. Taking medication as prescribed: Yes. Toleration medication: Yes. Family/Significant other contact made: No, will contact:  the patient's friend Patient understands diagnosis:  Yes. Discussing patient identified problems/goals with staff: Yes. Medical problems stabilized or resolved: Yes. Denies suicidal/homicidal ideation: Yes. Issues/concerns per patient self-inventory: No. Other:   New problem(s) identified: None   New Short Term/Long Term Goal(s): medication stabilization, elimination of SI thoughts, development of comprehensive mental wellness plan.   Patient Goals:  To try to handle my panic attacks better and communicate more with people when I'm feeling sad"  Discharge Plan or Barriers: Patient plans to discharge home with her roommates and follow up with her PCP through [facility] for medication management services and [facility] for therapy services at discharge. The patient also will have a hospital follow up appointment with [facility] for additional support on campus.   Reason for Continuation of Hospitalization: Anxiety Depression Medication stabilization Suicidal ideation  Estimated Length of Stay: 01/22/2019  Attendees: Patient: 01/20/2019 8:31 AM  Physician: Dr. Nehemiah Massed, MD 01/20/2019 8:31 AM  Nursing: Marchelle Folks.Salena Saner, RN 01/20/2019 8:31 AM  RN Care Manager: 01/20/2019 8:31 AM  Social Worker: Baldo Daub, Theresia Majors 01/20/2019 8:31 AM  Recreational Therapist:  01/20/2019 8:31 AM  Other: Marciano Sequin, NP 01/20/2019 8:31 AM  Other: Serena Colonel, NP 01/20/2019 8:31 AM  Other: 01/20/2019 8:31 AM    Scribe for Treatment Team: Maeola Sarah, LCSWA 01/20/2019 8:31 AM

## 2019-01-20 NOTE — Plan of Care (Signed)
  Problem: Education: Goal: Knowledge of [place] [facility] Education information/materials will improve Outcome: Progressing   Problem: Activity: Goal: Interest or engagement in activities will improve Outcome: Progressing   Problem: Health Behavior/Discharge Planning: Goal: Compliance with treatment plan for underlying cause of condition will improve Outcome: Progressing   Problem: Safety: Goal: Periods of time without injury will increase Outcome: Progressing   

## 2019-01-20 NOTE — [facility] Suicide Risk Assessment (Signed)
[facility] INPATIENT:  Family/Significant Other Suicide Prevention Education  Suicide Prevention Education:  Contact Attempts: Cleophus Molt (pt's friend) [phone], (name of family member/significant other) has been identified by the patient as the family member/significant other with whom the patient will be residing, and identified as the person(s) who will aid the patient in the event of a mental health crisis.  With written consent from the patient, two attempts were made to provide suicide prevention education, prior to and/or following the patient's discharge.  We were unsuccessful in providing suicide prevention education.  A suicide education pamphlet was given to the patient to share with family/significant other.  Date and time of first attempt: 02/17/2019 at 3:37 PM Date and time of second attempt: 01/27/2019 at 11:13 AM  Shellia Cleverly 01/20/2019, 11:15 AM

## 2019-01-20 NOTE — Progress Notes (Signed)
Pt attended wrap-p group. Pt appears animated in affect and mood. Pt denies SI/HI/AVH/Pain at this time. Pt states she is going to OPT therapy upon d/c. No new c/o's. Support. Will continue with POC.

## 2019-01-20 NOTE — [facility] Group Notes (Signed)
LCSW Group Therapy Note 01/20/2019 1:52 PM  Type of Therapy and Topic: Group Therapy: Overcoming Obstacles  Participation Level: Active  Description of Group:  In this group patients will be encouraged to explore what they see as obstacles to their own wellness and recovery. They will be guided to discuss their thoughts, feelings, and behaviors related to these obstacles. The group will process together ways to cope with barriers, with attention given to specific choices patients can make. Each patient will be challenged to identify changes they are motivated to make in order to overcome their obstacles. This group will be process-oriented, with patients participating in exploration of their own experiences as well as giving and receiving support and challenge from other group members.  Therapeutic Goals: 1. Patient will identify personal and current obstacles as they relate to admission. 2. Patient will identify barriers that currently interfere with their wellness or overcoming obstacles.  3. Patient will identify feelings, thought process and behaviors related to these barriers. 4. Patient will identify two changes they are willing to make to overcome these obstacles:   Summary of Patient Progress  Cheyenne Schwartz was engaged and participated throughout the group session. Cheyenne Schwartz reports that she is expecting to discharge tomorrow. Cheyenne Schwartz reports having no current obstacles.    Therapeutic Modalities:  Cognitive Behavioral Therapy Solution Focused Therapy Motivational Interviewing Relapse Prevention Therapy   Alcario Drought Clinical Social Worker

## 2019-01-20 NOTE — Progress Notes (Signed)
Recreation Therapy Notes  Date:  2. 24. 20 Time: 0930 Location: 300 Hall Dayroom  Group Topic: Stress Management  Goal Area(s) Addresses:  Patient will identify positive stress management techniques. Patient will identify benefits of using stress management post d/c.  Intervention: Worksheet  Activity :  Choice Meditation.  LRT introduced the stress management technique of meditation.  LRT played a meditation that focused on having a choice in changing the way they think about things.  Patients were to follow along as meditation played to engage in activity.  Education:  Stress Management, Discharge Planning.   Education Outcome: Acknowledges Education  Clinical Observations/Feedback: Pt did not attend group.     Shatiqua Heroux, LRT/CTRS         Kebron Pulse A 01/20/2019 11:06 AM 

## 2019-01-21 MED ORDER — SERTRALINE HCL 100 MG PO TABS: 100.0000 mg | ORAL_TABLET | Freq: Every day | ORAL | 0 refills | Status: DC

## 2019-01-21 MED ORDER — HYDROXYZINE HCL 25 MG PO TABS: 25.0000 mg | ORAL_TABLET | Freq: Three times a day (TID) | ORAL | 1 refills | Status: DC | PRN

## 2019-01-21 NOTE — [facility] Suicide Risk Assessment (Signed)
[facility] INPATIENT:  Family/Significant Other Suicide Prevention Education  Suicide Prevention Education:  SPE completed with patient, as patient refused to consent to family contact. SPI pamphlet provided to pt and pt was encouraged to share information with support network, ask questions, and talk about any concerns relating to SPE. Patient denies access to guns/firearms and verbalized understanding of information provided. Mobile Crisis information also provided to patient.  CSW attempted to contact the patient's friend prior to discharge. CSW was unsuccessful. Please see prior SPE documentation.   Baldo Daub, MSW, LCSWA Clinical Social Worker [facility]  Phone: [phone]

## 2019-01-21 NOTE — Progress Notes (Signed)
Patient ID: Cheyenne Schwartz, female   DOB: 1996-11-06, 23 y.o.   MRN: [id number]  Discharge Note  Patient denies SI/HI and states readiness for discharge.  Written and verbal discharge instructions reviewed with the patient. Patient accepting to information and verbalized understanding with no concerns. All belongings returned to patient from the unit and secured lockers. Patient has completed their Suicide Safety Plan and has been provided Suicide Prevention resources. Patient provided an opportunity to complete and return Patient Satisfaction Survey.   Patient was safely escorted to the lobby for discharge. Patient discharged from [facility] with personal belongings, follow-up appointment in place and discharge paperwork.

## 2019-01-21 NOTE — Discharge Summary (Signed)
Physician Discharge Summary Note  Patient:  Cheyenne Schwartz is an 23 y.o., female MRN:  [id number] DOB:  08-Apr-1996 Patient phone:  [phone] (home)  Patient address:   [phone] [place] [address],  Total Time spent with patient: 15 minutes  Date of Admission:  01/19/2019 Date of Discharge: 01/21/2019  Reason for Admission:  Suicidal ideation  Principal Problem: Severe recurrent major depression without psychotic features [facility]) Discharge Diagnoses: Principal Problem:   Severe recurrent major depression without psychotic features [facility]   Past Psychiatric History: Per admission assessment: No prior psychiatric admissions . Denies history of suicide attempts or of self cutting . [facility]. Was recently started on Zoloft ( started two months ago ) for depression and anxiety, and states she feels it has helped partially, without side effects. Denies history of psychosis or of mania. Describes history of anxiety, describes history of panic attacks and some agoraphobia, which she states were originally triggered by her sister having seizures.   Past Medical History:  Past Medical History:  Diagnosis Date  . Anxiety   . Asthma   . Eczema    History reviewed. No pertinent surgical history. Family History: History reviewed. No pertinent family history. Family Psychiatric  History: Denies. Social History:  Social History   Substance and Sexual Activity  Alcohol Use Not Currently  . Frequency: Never     Social History   Substance and Sexual Activity  Drug Use Never    Social History   Socioeconomic History  . Marital status: Single    Spouse name: Not on file  . Number of children: Not on file  . Years of education: Not on file  . Highest education level: Not on file  Occupational History  . Not on file  Social Needs  . Financial resource strain: Not on file  . Food insecurity:    Worry: Not on file    Inability: Not  on file  . Transportation needs:    Medical: Not on file    Non-medical: Not on file  Tobacco Use  . Smoking status: Never Smoker  . Smokeless tobacco: Never Used  Substance and Sexual Activity  . Alcohol use: Not Currently    Frequency: Never  . Drug use: Never  . Sexual activity: Not Currently  Lifestyle  . Physical activity:    Days per week: Not on file    Minutes per session: Not on file  . Stress: Not on file  Relationships  . Social connections:    Talks on phone: Not on file    Gets together: Not on file    Attends religious service: Not on file    Active member of club or organization: Not on file    Attends meetings of clubs or organizations: Not on file    Relationship status: Not on file  Other Topics Concern  . Not on file  Social History Narrative  . 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 Patient reports she has been feeling depressed, and had been experiencing suicidal ideations, which she describes as passive. On day of admission she called a hotline number and came to ED voluntarily. Reports depression has worsened recently in the context of relationship stressors/ recent break up. She also describes increased anxiety. Endorses neuro-vegetative symptoms as below- hypersomnia, " stress eating", mild anhedonia, decreased  sense of self esteem. Denies psychosis.    Cheyenne Schwartz was admitted for depression with suicidal ideation. She was continued on Zoloft. PRN Vistaril was started for anxiety. She participated in group therapy on the unit. She responded well to treatment with no adverse effects reported. She remained on the [facility] unit for 3 days. She stabilized with medication and therapy. She was discharged on the medications listed below. She has shown improvement with improved mood, affect, sleep, appetite, and interaction. She denies any  SI/HI/AVH and contracts for safety. She agrees to [facility], [facility], and [facility] (see below). Patient is provided with prescriptions for medications upon discharge. Patient reports her car is in the [facility] parking lot, and she is driving home.  Physical Findings: AIMS: Facial and Oral Movements Muscles of Facial Expression: None, normal Lips and Perioral Area: None, normal Jaw: None, normal Tongue: None, normal,Extremity Movements Upper (arms, wrists, hands, fingers): None, normal Lower (legs, knees, ankles, toes): None, normal, Trunk Movements Neck, shoulders, hips: None, normal, Overall Severity Severity of abnormal movements (highest score from questions above): None, normal Incapacitation due to abnormal movements: None, normal Patient's awareness of abnormal movements (rate only patient's report): No Awareness, Dental Status Current problems with teeth and/or dentures?: No Does patient usually wear dentures?: No  CIWA:  CIWA-Ar Total: 0 COWS:  COWS Total Score: 1  Musculoskeletal: Strength & Muscle Tone: within normal limits Gait & Station: normal Patient leans: N/A  Psychiatric Specialty Exam: Physical Exam  Nursing note and vitals reviewed. Constitutional: She is oriented to person, place, and time. She appears well-developed and well-nourished.  Cardiovascular: Normal rate.  Respiratory: Effort normal.  Neurological: She is alert and oriented to person, place, and time.    Review of Systems  Constitutional: Negative.   Psychiatric/Behavioral: Positive for depression (improving). Negative for hallucinations, memory loss, substance abuse and suicidal ideas. The patient is not nervous/anxious and does not have insomnia.     Blood pressure 111/85, pulse (!) 106, temperature 97.9 F (36.6 C), temperature source Oral, resp. rate 16, height 5\' 5"  (1.651 m), weight 72.6 kg, last menstrual period 01/12/2019.Body mass  index is 26.63 kg/m.  See MD's discharge SRA     Have you used any form of tobacco in the last 30 days? (Cigarettes, Smokeless Tobacco, Cigars, and/or Pipes): No  Has this patient used any form of tobacco in the last 30 days? (Cigarettes, Smokeless Tobacco, Cigars, and/or Pipes) No  Blood Alcohol level:  Lab Results  Component Value Date   ETH <10 01/18/2019    Metabolic Disorder Labs:  Lab Results  Component Value Date   HGBA1C 4.7 (L) 01/19/2019   MPG 88.19 01/19/2019   No results found for: PROLACTIN Lab Results  Component Value Date   CHOL 260 (H) 01/19/2019   TRIG 61 01/19/2019   HDL 77 01/19/2019   CHOLHDL 3.4 01/19/2019   VLDL 12 01/19/2019   LDLCALC 171 (H) 01/19/2019    See Psychiatric Specialty Exam and Suicide Risk Assessment completed by Attending Physician prior to discharge.  Discharge destination:  Home  Is patient on multiple antipsychotic therapies at discharge:  No   Has Patient had three or more failed trials of antipsychotic monotherapy by history:  No  Recommended Plan for Multiple Antipsychotic Therapies: NA  Discharge Instructions    Discharge instructions   Complete by:  As directed    Patient is instructed to take all prescribed medications as recommended. Report any  side effects or adverse reactions to your outpatient psychiatrist. Patient is instructed to abstain from alcohol and illegal drugs while on prescription medications. In the event of worsening symptoms, patient is instructed to call the crisis hotline, 911, or go to the nearest emergency department for evaluation and treatment.     Allergies as of 01/21/2019      Reactions   Apple Itching   Itchy throat    Chocolate Flavor Hives, Itching   Fish Allergy Itching   Banana Itching   Itchy Throat   Bee Pollen    Latex Hives   Peanut Oil Itching   Itchy Throat   Pollen Extract    Shellfish Allergy Itching   Itchy throat      Medication List    STOP taking these  medications   IRON PO   LORazepam 0.5 MG tablet Commonly known as:  ATIVAN     TAKE these medications     Indication  FLOVENT HFA 110 MCG/ACT inhaler Generic drug:  fluticasone Inhale 2 puffs into the lungs 2 (two) times daily.  Indication:  Asthma   fluticasone 50 MCG/ACT nasal spray Commonly known as:  FLONASE Place 2 sprays into both nostrils daily as needed for rhinitis.  Indication:  Allergic Rhinitis   hydrOXYzine 25 MG tablet Commonly known as:  ATARAX/VISTARIL Take 1 tablet (25 mg total) by mouth every 8 (eight) hours as needed for anxiety or itching. What changed:    how much to take  reasons to take this  Indication:  Feeling Anxious   montelukast 10 MG tablet Commonly known as:  SINGULAIR Take 10 mg by mouth daily.  Indication:  Asthma   pantoprazole 20 MG tablet Commonly known as:  PROTONIX Take 1 tablet (20 mg total) by mouth daily.  Indication:  Gastroesophageal Reflux Disease   PROAIR RESPICLICK 108 (90 Base) MCG/ACT Aepb Generic drug:  Albuterol Sulfate Inhale 2 puffs into the lungs every 6 (six) hours as needed (SOB, wheezing).  Indication:  Shortness of breath   sertraline 100 MG tablet Commonly known as:  ZOLOFT Take 1 tablet (100 mg total) by mouth at bedtime. For mood What changed:    when to take this  additional instructions  Indication:  Mood   triamcinolone ointment 0.1 % Commonly known as:  KENALOG Apply 1 application topically daily as needed.  Indication:  Eczema   ZYRTEC ALLERGY 10 MG tablet Generic drug:  cetirizine Take 10 mg by mouth daily.  Indication:  Hayfever      Follow-up Information    [facility] Follow up on 01/29/2019.   Why:  Your therapy appointment with Alyssa is Wednesday, 3/4 at 12:00p. Please call within 24 hours of discharge to hold appointment and pay the $20 deposit. Please bring your photo ID, proof of insurance, and SSN. Contact information: [address] [place]  [address] [phone]        [facility] Follow up on 01/27/2019.   Why:  Medication management appointment with Dr. Vickey Sages is Monday, 3/2 at 11:00a.  Please bring you current medications and discharge paperwork from this hospitalization.  Contact information: [address] [place], [place] [address] Phone: [phone] Fax: [phone]        [facility] Follow up on 02/04/2019.   Why:  Your next therapy appointment with Lawson Fiscal is Tuesday, 3/10 at 10:00a.  Contact information: [place]age. [facility] [address] [place], [place] [address] Phone: [phone] Fax: [phone]  Follow-up recommendations: Activity as tolerated. Diet as recommended by primary care physician. Keep all scheduled follow-up appointments as recommended.   Comments:   Patient is instructed to take all prescribed medications as recommended. Report any side effects or adverse reactions to your outpatient psychiatrist. Patient is instructed to abstain from alcohol and illegal drugs while on prescription medications. In the event of worsening symptoms, patient is instructed to call the crisis hotline, 911, or go to the nearest emergency department for evaluation and treatment.  Signed: Aldean Baker, NP 01/21/2019, 9:09 AM

## 2019-01-21 NOTE — Progress Notes (Signed)
Patient ID: Cheyenne Schwartz, female   DOB: Jun 01, 1996, 23 y.o.   MRN: [id number]  Nursing Progress Note [id number]  On initial approach, patient is seen up in the milieu. Patient presents with pleasant mood and animated affect. Patient compliant with scheduled medications and denies need for PRNs. Patient is seen attending groups and visible in the milieu. Patient currently denies SI/HI/AVH.   Patient is educated about and provided medication per provider's orders. Patient safety maintained with q15 min safety checks and low fall risk precautions. Emotional support given, 1:1 interaction, and active listening provided. Patient encouraged to attend meals, groups, and work on treatment plan and goals. Labs, vital signs and patient behavior monitored throughout shift.   Patient contracts for safety with staff. Patient remains safe on the unit at this time and agrees to come to staff with any issues/concerns. Patient is interacting with peers appropriately on the unit. Will continue to support and monitor.   Patient's self-inventory sheet Rated Energy Level  Normal  Rated Sleep  Good  Rated Appetite  Good  Rated Anxiety (0-10)  0  Rated Hopelessness (0-10)  0  Rated Depression (0-10)  0  Daily Goal  "to work on discharge today and stay positive"  Any Additional Comments:  N/A

## 2019-01-21 NOTE — Progress Notes (Signed)
Pt attended spiritual care group on grief and loss facilitated by chaplain Burnis Kingfisher   Group opened with brief discussion and psycho-social ed around grief and loss in relationships and in relation to self - identifying life patterns, circumstances, changes that cause losses. Established group norm of speaking from own life experience. Group goal of establishing open and affirming space for members to share loss and experience with grief, normalize grief experience and provide psycho social education and grief support.    Cheyenne Schwartz was present for first half of group.  Was pulled from group to meet with peer support professional.  Did not return to group.    At group introductions she described thinking of "memories" when she thinks of grief and acknowledged that the world can feel different after a loss.  Spoke of grief around losing relationships with people who are still alive.  Was not specific about relationship changes.    Burnis Kingfisher, MDiv, [facility]

## 2019-01-21 NOTE — [facility] Suicide Risk Assessment (Signed)
[facility] Discharge Suicide Risk Assessment   Principal Problem: Severe recurrent major depression without psychotic features [facility]) Discharge Diagnoses: Principal Problem:   Severe recurrent major depression without psychotic features (HCC)   Total Time spent with patient: 20 minutes  Musculoskeletal: Strength & Muscle Tone: within normal limits Gait & Station: normal Patient leans: N/A  Psychiatric Specialty Exam: Review of Systems  All other systems reviewed and are negative.   Blood pressure 111/85, pulse (!) 106, temperature 97.9 F (36.6 C), temperature source Oral, resp. rate 16, height 5\' 5"  (1.651 m), weight 72.6 kg, last menstrual period 01/12/2019.Body mass index is 26.63 kg/m.  General Appearance: Casual  Eye Contact::  Fair  Speech:  Normal Rate409  Volume:  Normal  Mood:  Euthymic  Affect:  Congruent  Thought Process:  Coherent and Descriptions of Associations: Intact  Orientation:  Full (Time, Place, and Person)  Thought Content:  Logical  Suicidal Thoughts:  No  Homicidal Thoughts:  No  Memory:  Immediate;   Fair Recent;   Fair Remote;   Fair  Judgement:  Intact  Insight:  Fair  Psychomotor Activity:  Normal  Concentration:  Fair  Recall:  Good  Fund of Knowledge:Good  Language: Good  Akathisia:  Negative  Handed:  Right  AIMS (if indicated):     Assets:  Communication Skills Desire for Improvement Financial Resources/Insurance Housing Leisure Time Physical Health Resilience  Sleep:  Number of Hours: 6.75  Cognition: WNL  ADL's:  Intact   Mental Status Per Nursing Assessment::   On Admission:  Suicidal ideation indicated by patient(prior to admit)  Demographic Factors:  Adolescent or young adult  Loss Factors: NA  Historical Factors: Impulsivity  Risk Reduction Factors:   Sense of responsibility to family, Living with another person, especially a relative, Positive social support and Positive coping skills or problem solving  skills  Continued Clinical Symptoms:  Depression:   Impulsivity  Cognitive Features That Contribute To Risk:  None    Suicide Risk:  Minimal: No identifiable suicidal ideation.  Patients presenting with no risk factors but with morbid ruminations; may be classified as minimal risk based on the severity of the depressive symptoms  Follow-up Information    [facility] Follow up on 01/29/2019.   Why:  Your therapy appointment with Alyssa is Wednesday, 3/4 at 12:00p. Please call within 24 hours of discharge to hold appointment and pay the $20 deposit. Please bring your photo ID, proof of insurance, and SSN. Contact information: [address] [place] [address] [phone]        [facility] Follow up on 01/27/2019.   Why:  Medication management appointment with Dr. Vickey Sages is Monday, 3/2 at 11:00a.  Please bring you current medications and discharge paperwork from this hospitalization.  Contact information: [address] [place], [place] [address] Phone: [phone] Fax: [phone]        [facility] Follow up on 02/04/2019.   Why:  Your next therapy appointment with Lawson Fiscal is Tuesday, 3/10 at 10:00a.  Contact information: [place]. [facility] [address] [place], [place] [address] Phone: [phone] Fax: [phone]          Plan Of Care/Follow-up recommendations:  Activity:  ad lib  Antonieta Pert, MD 01/21/2019, 9:04 AM

## 2019-01-21 NOTE — Progress Notes (Signed)
  [facility] Adult Case Management Discharge Plan :  Will you be returning to the same living situation after discharge:  Yes,  patient reports she is returning home with her roommates At discharge, do you have transportation home?: Yes,  patient reports her car is in the [facility] parking lot Do you have the ability to pay for your medications: Yes,  income from employment and [employer]  Release of information consent forms completed and in the chart;  Patient's signature needed at discharge.  Patient to Follow up at: Follow-up Information    [facility] Follow up on 01/29/2019.   Why:  Your therapy appointment with Alyssa is Wednesday, 3/4 at 12:00p. Please call within 24 hours of discharge to hold appointment and pay the $20 deposit. Please bring your photo ID, proof of insurance, and SSN. Contact information: [address] [place] [address] [phone]        [facility] Follow up on 01/27/2019.   Why:  Medication management appointment with Dr. Vickey Sages is Monday, 3/2 at 11:00a.  Please bring you current medications and discharge paperwork from this hospitalization.  Contact information: [address] [place], [place] [address] Phone: [phone] Fax: [phone]        [facility] Follow up on 02/04/2019.   Why:  Your next therapy appointment with Lawson Fiscal is Tuesday, 3/10 at 10:00a.  Contact information: [place]. [facility] [address] [place], [place] [address] Phone: [phone] Fax: [phone]          Next level of care provider has access to [facility] Link:yes  Safety Planning and Suicide Prevention discussed: Yes,  with the patient   Have you used any form of tobacco in the last 30 days? (Cigarettes, Smokeless Tobacco, Cigars, and/or Pipes): No  Has patient been referred to the Quitline?: N/A patient is not a smoker  Patient has been referred for addiction treatment: N/A  Maeola Sarah,  LCSWA 01/21/2019, 10:47 AM

## 2019-01-21 NOTE — Plan of Care (Signed)
  Problem: Education: Goal: Knowledge of [facility] Education information/materials will improve Outcome: Progressing   Problem: Activity: Goal: Interest or engagement in activities will improve Outcome: Progressing   Problem: Health Behavior/Discharge Planning: Goal: Compliance with treatment plan for underlying cause of condition will improve Outcome: Progressing   Problem: Safety: Goal: Periods of time without injury will increase Outcome: Progressing   

## 2019-08-13 ENCOUNTER — Other Ambulatory Visit: Payer: Self-pay

## 2019-08-13 DIAGNOSIS — Z20822 Contact with and (suspected) exposure to covid-19: Secondary | ICD-10-CM

## 2019-08-14 LAB — NOVEL CORONAVIRUS, NAA: SARS-CoV-2, NAA: NOT DETECTED

## 2021-02-28 ENCOUNTER — Emergency Department (HOSPITAL_COMMUNITY): Payer: BC Managed Care – PPO

## 2021-02-28 ENCOUNTER — Other Ambulatory Visit: Payer: Self-pay

## 2021-02-28 ENCOUNTER — Encounter (HOSPITAL_COMMUNITY): Payer: Self-pay

## 2021-02-28 ENCOUNTER — Emergency Department (HOSPITAL_COMMUNITY)
Admission: EM | Admit: 2021-02-28 | Discharge: 2021-03-01 | Disposition: A | Discharge status: Home / Self Care | Payer: BC Managed Care – PPO | Attending: Emergency Medicine | Admitting: Emergency Medicine

## 2021-02-28 DIAGNOSIS — J45909 Unspecified asthma, uncomplicated: Secondary | ICD-10-CM | POA: Insufficient documentation

## 2021-02-28 DIAGNOSIS — Y93B9 Activity, other involving muscle strengthening exercises: Secondary | ICD-10-CM | POA: Diagnosis not present

## 2021-02-28 DIAGNOSIS — X501XXA Overexertion from prolonged static or awkward postures, initial encounter: Secondary | ICD-10-CM | POA: Insufficient documentation

## 2021-02-28 DIAGNOSIS — Z91040 Latex allergy status: Secondary | ICD-10-CM | POA: Insufficient documentation

## 2021-02-28 DIAGNOSIS — M25562 Pain in left knee: Secondary | ICD-10-CM | POA: Insufficient documentation

## 2021-02-28 DIAGNOSIS — Z91010 Allergy to peanuts: Secondary | ICD-10-CM | POA: Diagnosis not present

## 2021-02-28 DIAGNOSIS — Z7951 Long term (current) use of inhaled steroids: Secondary | ICD-10-CM | POA: Insufficient documentation

## 2021-02-28 MED ORDER — NAPROXEN 500 MG PO TABS: 500.0000 mg | ORAL_TABLET | Freq: Two times a day (BID) | ORAL | 0 refills | Status: DC | PRN

## 2021-02-28 MED ORDER — OXYCODONE-ACETAMINOPHEN 5-325 MG PO TABS
1.0000 | ORAL_TABLET | Freq: Once | ORAL | Status: AC
  Administered 2021-02-28: 1 via ORAL
  Filled 2021-02-28: qty 1

## 2021-02-28 NOTE — ED Triage Notes (Signed)
Pt arrives EMS after twisting left knee in a workout class.

## 2021-02-28 NOTE — ED Provider Notes (Addendum)
MSE was initiated and I personally evaluated the patient and placed orders (if any) at  8:55 PM on February 28, 2021.   Went to do a jumping lunge and felt her knee shift and go out of place somehow. Hx of patella subluxation, didn't feel the same because today is worse. Generalized pain to knee.   BP (!) 143/98 (BP Location: Left Arm)   Pulse 97   Temp 98.6 F (37 C) (Oral)   Resp 16   SpO2 98%   Moderate swelling to anterior aspect, generally tender anteriorly. No obvious deformity. Some ROM. Overall seems stable. No wounds.  Xray and pain medication ordered.   The patient appears stable so that the remainder of the MSE may be completed by another provider.   Omeka Holben, [place] N, PA-C 02/28/21 1638    Aramis Weil, [place] N, PA-C 02/28/21 2059    Koleen Distance, MD 02/28/21 2351

## 2021-02-28 NOTE — ED Provider Notes (Signed)
[facility]-EMERGENCY DEPT Provider Note   CSN: [id number] Arrival date & time: 02/28/21  1954     History Chief Complaint  Patient presents with  . Knee Pain    Cheyenne Schwartz is a 25 y.o. female who presents to the emergency department with complaints of left knee pain status post injury earlier today.  Patient states that she was in an exercise class, attempted to do a jump lunge, and felt like her lower extremity went in different directions.  She had sudden onset pain to the left knee which has been constant since onset, alleviated some by Percocet she received in triage, worse with movement.  Denies any other areas of injury.  Denies numbness, tingling, weakness, or open wounds.  HPI     Past Medical History:  Diagnosis Date  . Anxiety   . Asthma   . Eczema     Patient Active Problem List   Diagnosis Date Noted  . 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    History reviewed. No pertinent surgical history.   OB History   No obstetric history on file.     No family history on file.  Social History   Tobacco Use  . Smoking status: Never Smoker  . Smokeless tobacco: Never Used  Vaping Use  . Vaping Use: Never used  Substance Use Topics  . Alcohol use: Not Currently  . Drug use: Never    Home Medications Prior to Admission medications   Medication Sig Start Date End Date Taking? Authorizing Provider  cetirizine (ZYRTEC ALLERGY) 10 MG tablet Take 10 mg by mouth daily. 12/08/08   [provider]  FLOVENT HFA 110 MCG/ACT inhaler Inhale 2 puffs into the lungs 2 (two) times daily. 12/23/18   [provider]  fluticasone (FLONASE) 50 MCG/ACT nasal spray Place 2 sprays into both nostrils daily as needed for rhinitis.  10/03/16   [provider]  hydrOXYzine (ATARAX/VISTARIL) 25 MG tablet Take 1 tablet (25 mg total) by mouth every 8 (eight) hours as needed for anxiety or itching.  01/21/19   Aldean Baker, NP  montelukast (SINGULAIR) 10 MG tablet Take 10 mg by mouth daily. 08/26/18   [provider]  pantoprazole (PROTONIX) 20 MG tablet Take 1 tablet (20 mg total) by mouth daily. 12/08/18   Bethel Born, PA-C  PROAIR RESPICLICK 108 [phone] Base) MCG/ACT AEPB Inhale 2 puffs into the lungs every 6 (six) hours as needed (SOB, wheezing).  10/30/18   [provider]  sertraline (ZOLOFT) 100 MG tablet Take 1 tablet (100 mg total) by mouth at bedtime. For mood 01/21/19   Aldean Baker, NP  triamcinolone ointment (KENALOG) 0.1 % Apply 1 application topically daily as needed.    [provider]    Allergies    Chocolate flavor, Cocoa, Latex, Other, Apple, Fish allergy, Peanut oil, Bee pollen, Pollen extract, Shellfish allergy, and Banana  Review of Systems   Review of Systems  Constitutional: Negative for chills and fever.  Respiratory: Negative for shortness of breath.   Cardiovascular: Negative for chest pain.  Musculoskeletal: Positive for arthralgias and joint swelling.  Skin: Negative for wound.  Neurological: Negative for syncope, weakness and numbness.  All other systems reviewed and are negative.   Physical Exam Updated Vital Signs BP (!) 143/98 (BP Location: Left Arm)   Pulse 97   Temp 98.6 F (37 C) (Oral)   Resp 16   SpO2 98%   Physical Exam  Vitals and nursing note reviewed.  Constitutional:      General: She is not in acute distress.    Appearance: She is not ill-appearing or toxic-appearing.  HENT:     Head: Normocephalic and atraumatic.  Cardiovascular:     Pulses:          Dorsalis pedis pulses are 2+ on the right side and 2+ on the left side.       Posterior tibial pulses are 2+ on the right side and 2+ on the left side.  Pulmonary:     Effort: Pulmonary effort is normal.  Musculoskeletal:     Comments: Lower extremities: Patient has soft tissue swelling to the left knee.  No open wounds, ecchymosis, or pitting  edema noted.  Patient has able to fully move all digits, the ankles, the right knee, and right hip actively.  Left hip and knee range of motion limited when in the seated position secondary to pain in the left knee.  Patient is able to fully extend the left knee, she is able to flex somewhat, and she is able to lift her lower extremity off the bed somewhat as well.  She is significantly tender to the medial aspect of the left knee and somewhat anteriorly.  Otherwise nontender.  Pain with valgus stress test of the left lower extremity.  Skin:    General: Skin is warm and dry.     Capillary Refill: Capillary refill takes less than 2 seconds.  Neurological:     Mental Status: She is alert.     Comments: Alert. Clear speech. Sensation grossly intact to bilateral lower extremities. 5/5 strength with plantar/dorsiflexion bilaterally.  Patient is able to flex/extend the bilateral knees against resistance when seated on the side of the bed.    Psychiatric:        Mood and Affect: Mood normal.        Behavior: Behavior normal.     ED Results / Procedures / Treatments   Labs (all labs ordered are listed, but only abnormal results are displayed) Labs Reviewed - No data to display  EKG None  Radiology DG Knee Complete 4 Views Left  Result Date: 02/28/2021 CLINICAL DATA:  25 year old female with trauma to the left knee. EXAM: LEFT KNEE - COMPLETE 4+ VIEW COMPARISON:  None. FINDINGS: There is no acute fracture or dislocation. The bones are well mineralized. No arthritic changes. Probable small suprapatellar effusion. The soft tissues are unremarkable. IMPRESSION: No acute fracture or dislocation. Electronically Signed   By: Elgie Collard M.D.   On: 02/28/2021 21:26    Procedures Procedures   Medications Ordered in ED Medications  oxyCODONE-acetaminophen (PERCOCET/ROXICET) 5-325 MG per tablet 1 tablet (1 tablet Oral Given 02/28/21 2115)    ED Course  I have reviewed the triage vital signs and  the nursing notes.  Pertinent labs & imaging results that were available during my care of the patient were reviewed by me and considered in my medical decision making (see chart for details).    MDM Rules/Calculators/A&P                         Patient presents to the ED with complaints of pain to the  Left knee s/p injury. Exam without obvious deformity or open wounds.  Swelling noted.  Flexion ROM limited, however is able to flex/extend against resistance.  Tender to palpation anteriorly and medially. NVI distally. Xray negative for fracture/dislocation-personally reviewed and interpreted imaging.Marland Kitchen  Knee immobilizer and crutches provided.  Concern for possible ligament/tendon injury. PRICE recommended, prescription for naproxen, orthopedics follow-up.. I discussed results, treatment plan, need for follow-up, and return precautions with the patient. Provided opportunity for questions, patient confirmed understanding and are in agreement with plan.   Final Clinical Impression(s) / ED Diagnoses Final diagnoses:  Acute pain of left knee    Rx / DC Orders ED Discharge Orders         Ordered    naproxen (NAPROSYN) 500 MG tablet  2 times daily PRN        02/28/21 2328           Khasir Woodrome, Pleas Koch, PA-C 02/28/21 2329    Dione Booze, MD 03/01/21 [phone]

## 2021-02-28 NOTE — Discharge Instructions (Addendum)
Please read and follow all provided instructions.  You have been seen today for an injury to your left knee.   Tests performed today include: An x-ray of the affected area - does NOT show any broken bones or dislocations.  Vital signs. See below for your results today.   Home care instructions: -- *PRICE in the first 24-48 hours after injury: Protect (with brace, splint, sling), if given by your provider Rest Ice- Do not apply ice pack directly to your skin, place towel or similar between your skin and ice/ice pack. Apply ice for 20 min, then remove for 40 min while awake Compression- Wear brace, elastic bandage, splint as directed by your provider Elevate affected extremity above the level of your heart when not walking around for the first 24-48 hours   Medications:  - Naproxen is a nonsteroidal anti-inflammatory medication that will help with pain and swelling. Be sure to take this medication as prescribed with food, 1 pill every 12 hours,  It should be taken with food, as it can cause stomach upset, and more seriously, stomach bleeding. Do not take other nonsteroidal anti-inflammatory medications with this such as Advil, Motrin, Aleve, Mobic, Goodie Powder, or Motrin.    You make take Tylenol per over the counter dosing with these medications.   We have prescribed you new medication(s) today. Discuss the medications prescribed today with your pharmacist as they can have adverse effects and interactions with your other medicines including over the counter and prescribed medications. Seek medical evaluation if you start to experience new or abnormal symptoms after taking one of these medicines, seek care immediately if you start to experience difficulty breathing, feeling of your throat closing, facial swelling, or rash as these could be indications of a more serious allergic reaction   Follow-up instructions: Please follow-up with your primary care provider or the provided orthopedic  physician (bone specialist) if you continue to have significant pain in 1 week. In this case you may have a more severe injury that requires further care.   Return instructions:  Please return if your digits or extremity are numb or tingling, appear gray or blue, or you have severe pain (also elevate the extremity and loosen splint or wrap if you were given one) Please return if you have redness or fevers.  Please return to the Emergency Department if you experience worsening symptoms.  Please return if you have any other emergent concerns. Additional Information:  Your vital signs today were: BP (!) 143/98 (BP Location: Left Arm)   Pulse 97   Temp 98.6 F (37 C) (Oral)   Resp 16   SpO2 98%  If your blood pressure (BP) was elevated above 135/85 this visit, please have this repeated by your doctor within one month. ---------------

## 2021-03-01 NOTE — ED Notes (Signed)
Pt declined to take crutches. She stated she would borrow some. RN verbally reviewed how to use crutches. Knee immobilizer applied.

## 2021-03-01 NOTE — ED Notes (Signed)
Pt verbalized understanding of d/c, medication, and follow up care. Ambulatory with steady gait.  

## 2021-10-14 IMAGING — CR DG KNEE COMPLETE 4+V*L*
4 series · 4 of 4 positions shown · non-contrast
Comparison: None.

CLINICAL DATA: 24-year-old female with trauma to the left knee.

EXAM:
LEFT KNEE - COMPLETE 4+ VIEW

[t knee obl left (1 of 2)]
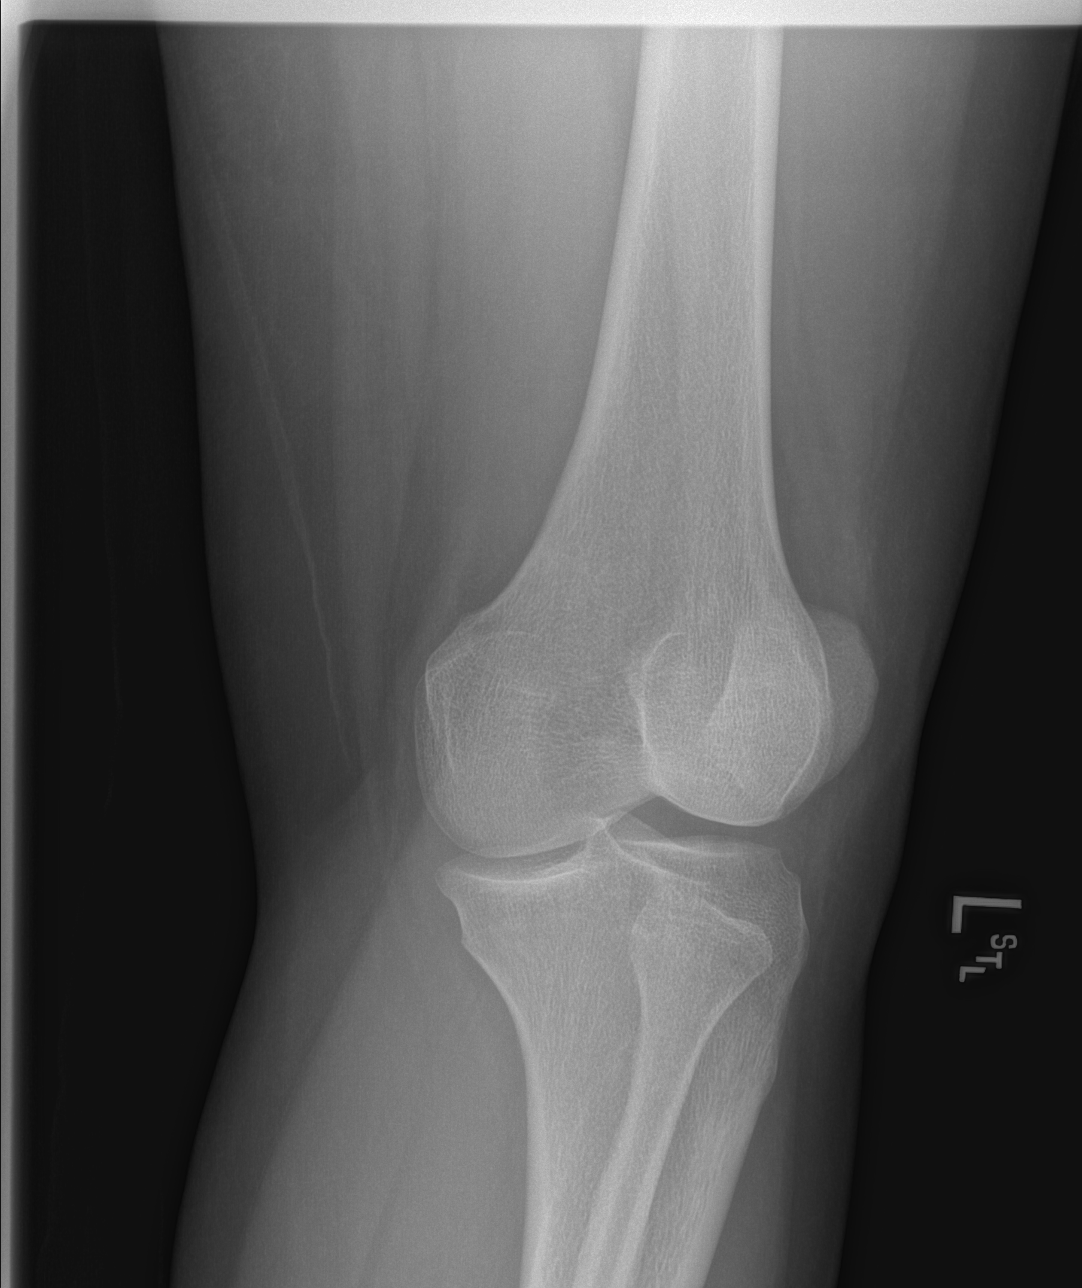

[t knee ap left]
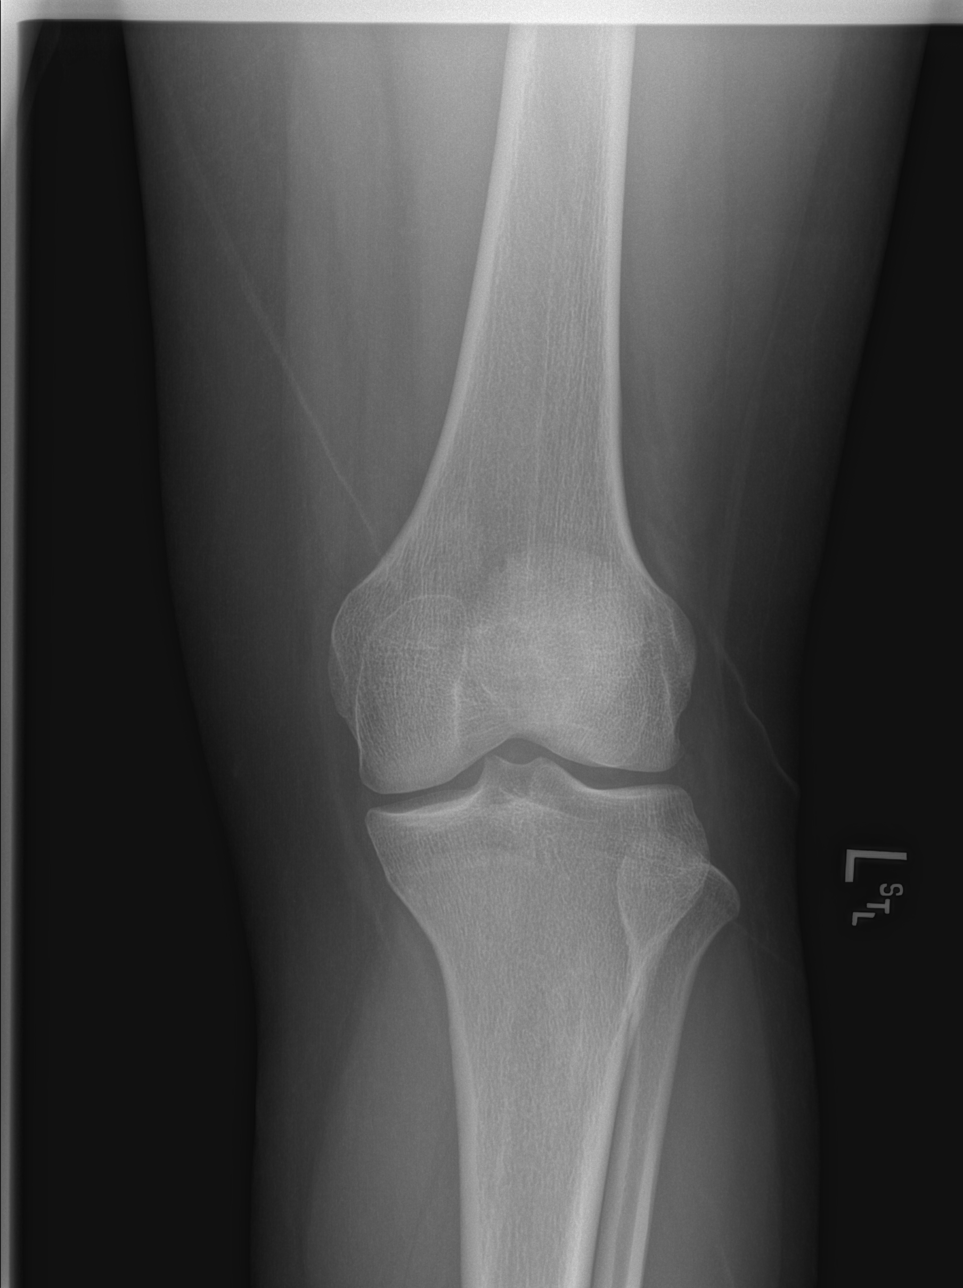

[t knee obl left (2 of 2)]
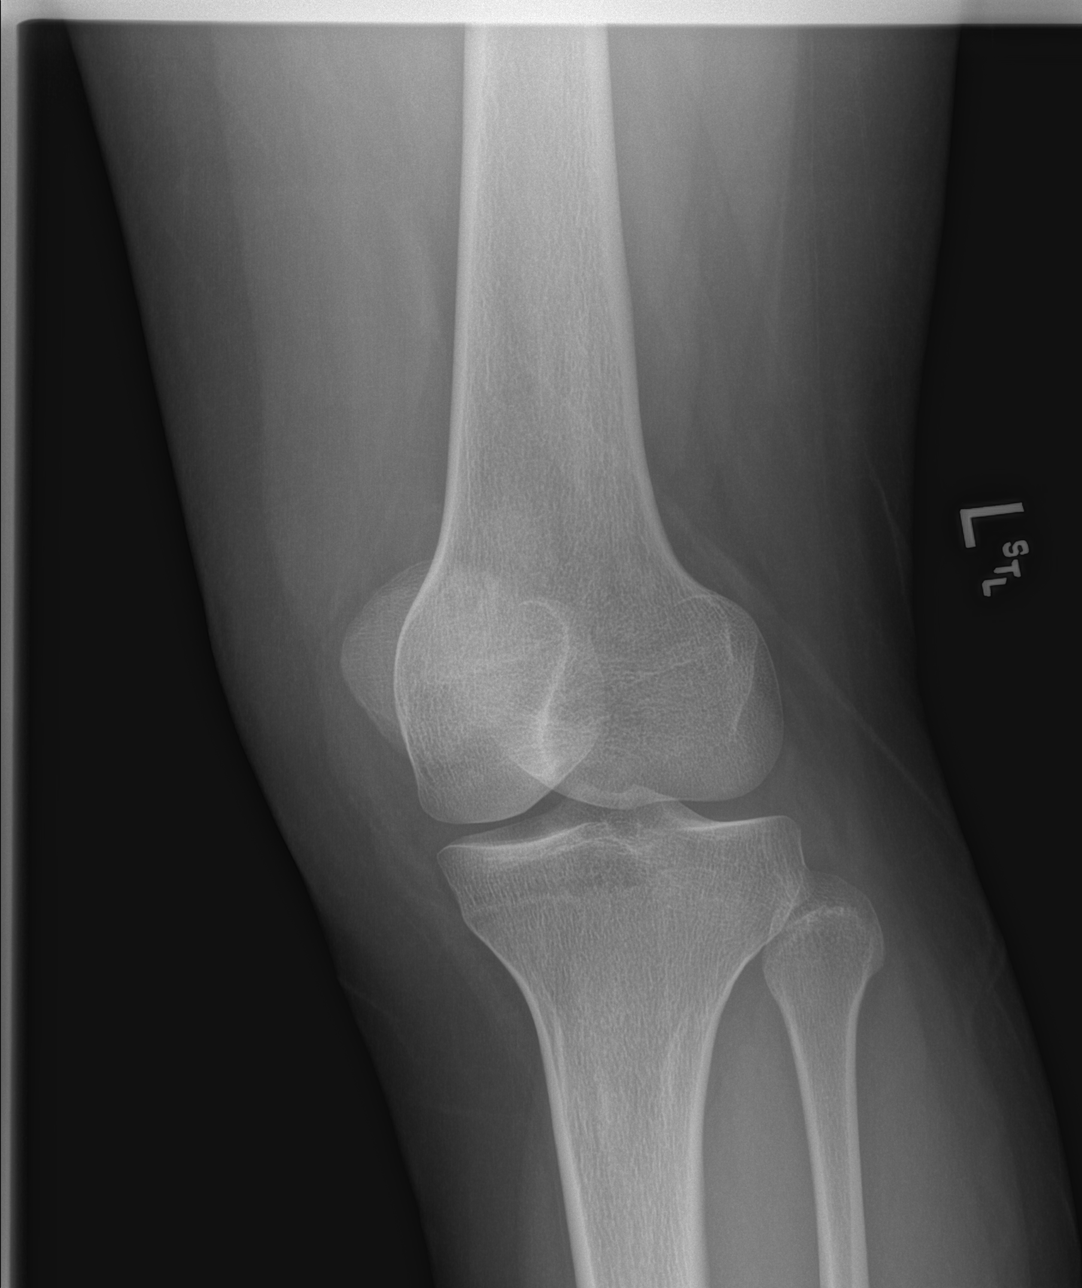

[t knee lat left]
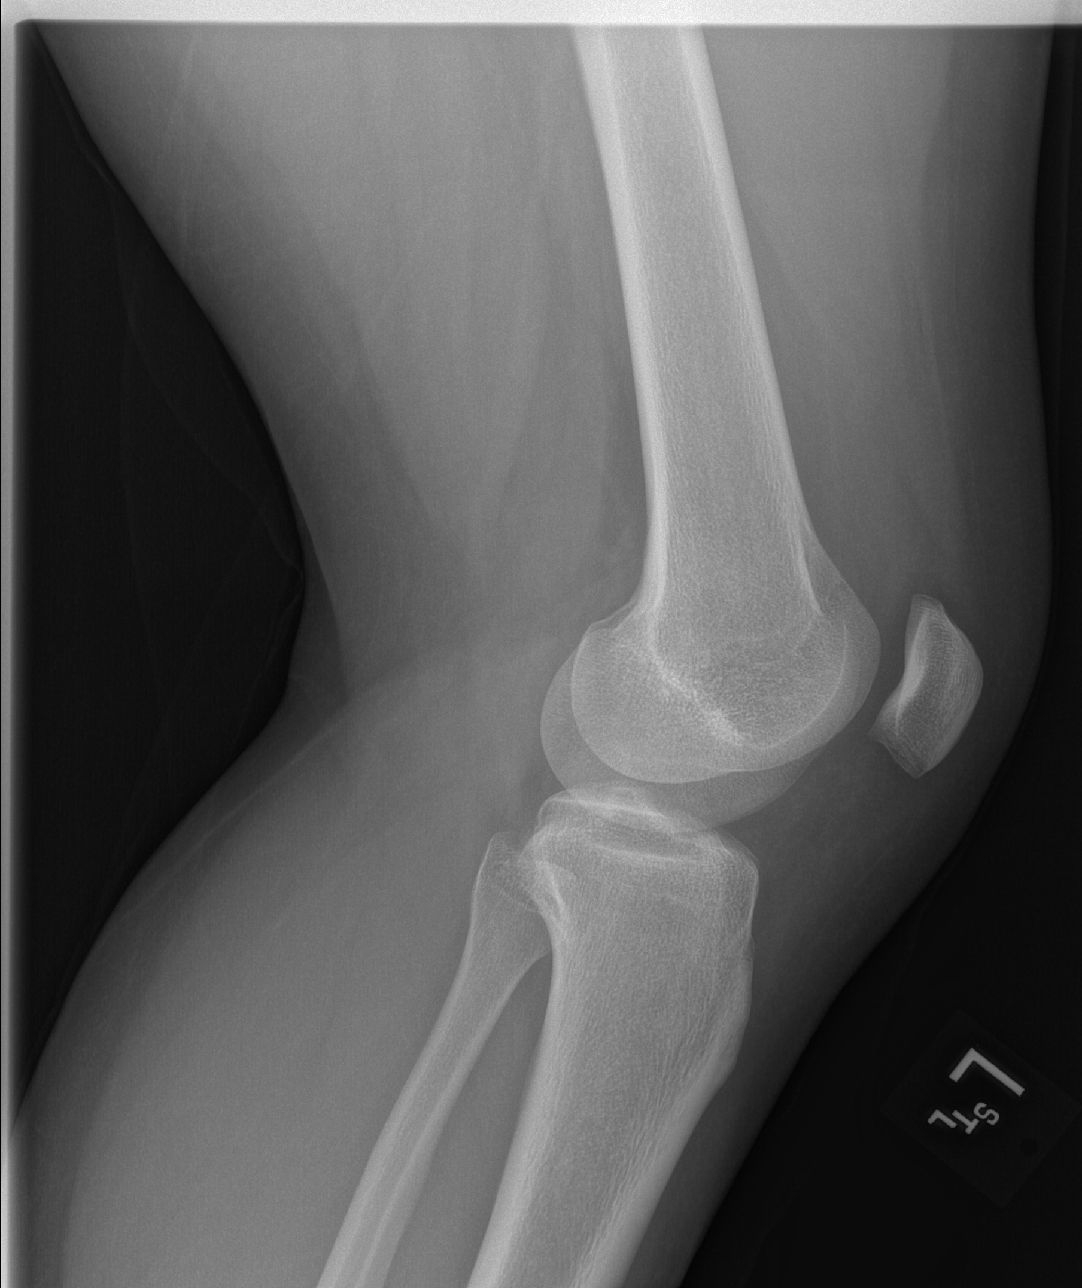

[4 of 4 positions shown; findings below may reference images not displayed]

FINDINGS: There is no acute fracture or dislocation. The bones are well
mineralized. No arthritic changes. Probable small suprapatellar
effusion. The soft tissues are unremarkable.
IMPRESSION: No acute fracture or dislocation.

## 2021-11-03 ENCOUNTER — Other Ambulatory Visit: Payer: Self-pay | Admitting: Orthopedic Surgery

## 2021-11-07 NOTE — Patient Instructions (Addendum)
DUE TO COVID-19 ONLY ONE VISITOR IS ALLOWED TO COME WITH YOU AND STAY IN THE WAITING ROOM ONLY DURING PRE OP AND PROCEDURE.   **NO VISITORS ARE ALLOWED IN THE SHORT STAY AREA OR RECOVERY ROOM!!**       Your procedure is scheduled on: 11/14/21   Report to [facility] Main Entrance    Report to admitting at 9:15 AM   Call this number if you have problems the morning of surgery [phone]   Do not eat food :After Midnight.   May have liquids until 8:30 AM day of surgery  CLEAR LIQUID DIET  Foods Allowed                                                                     Foods Excluded  Water, Black Coffee and tea (no milk or creamer)           liquids that you cannot  Plain Jell-O in any flavor  (No red)                                    see through such as: Fruit ices (not with fruit pulp)                                            milk, soups, orange juice              Iced Popsicles (No red)                                                All solid food                                   Apple juices Sports drinks like Gatorade (No red) Lightly seasoned clear broth or consume(fat free) Sugar     The day of surgery:  Drink ONE (1) Pre-Surgery Clear Ensure by 8:30 am the morning of surgery. Drink in one sitting. Do not sip.  This drink was given to you during your hospital  pre-op appointment visit. Nothing else to drink after completing the  Pre-Surgery Clear Ensure.          If you have questions, please contact your surgeon's office.     Oral Hygiene is also important to reduce your risk of infection.                                    Remember - BRUSH YOUR TEETH THE MORNING OF SURGERY WITH YOUR REGULAR TOOTHPASTE   Take these medicines the morning of surgery with A SIP OF WATER: Tylenol, Zyrtec  You may not have any metal on your body including hair pins, jewelry, and body piercing             Do not wear make-up, lotions,  powders, perfumes, or deodorant  Do not wear nail polish including gel and S&S, artificial/acrylic nails, or any other type of covering on natural nails including finger and toenails. If you have artificial nails, gel coating, etc. that needs to be removed by a nail salon please have this removed prior to surgery or surgery may need to be canceled/ delayed if the surgeon/ anesthesia feels like they are unable to be safely monitored.   Do not shave  48 hours prior to surgery.    Do not bring valuables to the hospital. [facility] IS NOT             RESPONSIBLE   FOR VALUABLES.    Patients discharged on the day of surgery will not be allowed to drive home.              Please read over the following fact sheets you were given: IF YOU HAVE QUESTIONS ABOUT YOUR PRE-OP INSTRUCTIONS PLEASE CALL [phone]- [facility] - Preparing for Surgery Before surgery, you can play an important role.  Because skin is not sterile, your skin needs to be as free of germs as possible.  You can reduce the number of germs on your skin by washing with CHG (chlorahexidine gluconate) soap before surgery.  CHG is an antiseptic cleaner which kills germs and bonds with the skin to continue killing germs even after washing. Please DO NOT use if you have an allergy to CHG or antibacterial soaps.  If your skin becomes reddened/irritated stop using the CHG and inform your nurse when you arrive at Short Stay. Do not shave (including legs and underarms) for at least 48 hours prior to the first CHG shower.  You may shave your face/neck.  Please follow these instructions carefully:  1.  Shower with CHG Soap the night before surgery and the  morning of surgery.  2.  If you choose to wash your hair, wash your hair first as usual with your normal  shampoo.  3.  After you shampoo, rinse your hair and body thoroughly to remove the shampoo.                             4.  Use CHG as you would any other liquid soap.  You can  apply chg directly to the skin and wash.  Gently with a scrungie or clean washcloth.  5.  Apply the CHG Soap to your body ONLY FROM THE NECK DOWN.   Do   not use on face/ open                           Wound or open sores. Avoid contact with eyes, ears mouth and   genitals (private parts).                       Wash face,  Genitals (private parts) with your normal soap.             6.  Wash thoroughly, paying special attention to the area where your    surgery  will be performed.  7.  Thoroughly rinse your body with warm water from the neck down.  8.  DO NOT shower/wash with your normal soap after using and rinsing off the CHG Soap.                9.  Pat yourself dry with a clean towel.            10.  Wear clean pajamas.            11.  Place clean sheets on your bed the night of your first shower and do not  sleep with pets. Day of Surgery : Do not apply any lotions/deodorants the morning of surgery.  Please wear clean clothes to the hospital/surgery center.  FAILURE TO FOLLOW THESE INSTRUCTIONS MAY RESULT IN THE CANCELLATION OF YOUR SURGERY  PATIENT SIGNATURE_________________________________  NURSE SIGNATURE__________________________________  ________________________________________________________________________   Cheyenne Schwartz  An incentive spirometer is a tool that can help keep your lungs clear and active. This tool measures how well you are filling your lungs with each breath. Taking long deep breaths may help reverse or decrease the chance of developing breathing (pulmonary) problems (especially infection) following: A long period of time when you are unable to move or be active. BEFORE THE PROCEDURE  If the spirometer includes an indicator to show your best effort, your nurse or respiratory therapist will set it to a desired goal. If possible, sit up straight or lean slightly forward. Try not to slouch. Hold the incentive spirometer in an upright position. INSTRUCTIONS  FOR USE  Sit on the edge of your bed if possible, or sit up as far as you can in bed or on a chair. Hold the incentive spirometer in an upright position. Breathe out normally. Place the mouthpiece in your mouth and seal your lips tightly around it. Breathe in slowly and as deeply as possible, raising the piston or the ball toward the top of the column. Hold your breath for 3-5 seconds or for as long as possible. Allow the piston or ball to fall to the bottom of the column. Remove the mouthpiece from your mouth and breathe out normally. Rest for a few seconds and repeat Steps 1 through 7 at least 10 times every 1-2 hours when you are awake. Take your time and take a few normal breaths between deep breaths. The spirometer may include an indicator to show your best effort. Use the indicator as a goal to work toward during each repetition. After each set of 10 deep breaths, practice coughing to be sure your lungs are clear. If you have an incision (the cut made at the time of surgery), support your incision when coughing by placing a pillow or rolled up towels firmly against it. Once you are able to get out of bed, walk around indoors and cough well. You may stop using the incentive spirometer when instructed by your caregiver.  RISKS AND COMPLICATIONS Take your time so you do not get dizzy or light-headed. If you are in pain, you may need to take or ask for pain medication before doing incentive spirometry. It is harder to take a deep breath if you are having pain. AFTER USE Rest and breathe slowly and easily. It can be helpful to keep track of a log of your progress. Your caregiver can provide you with a simple table to help with this. If you are using the spirometer at home, follow these instructions: SEEK MEDICAL CARE IF:  You are having difficultly using the spirometer. You have trouble using the spirometer as often as instructed. Your pain medication is not  giving enough relief while using  the spirometer. You develop fever of 100.5 F (38.1 C) or higher. SEEK IMMEDIATE MEDICAL CARE IF:  You cough up bloody sputum that had not been present before. You develop fever of 102 F (38.9 C) or greater. You develop worsening pain at or near the incision site. MAKE SURE YOU:  Understand these instructions. Will watch your condition. Will get help right away if you are not doing well or get worse. Document Released: 03/26/2007 Document Revised: 02/05/2012 Document Reviewed: 05/27/2007 [facility] Patient Information 2014 [place], [place].   ________________________________________________________________________

## 2021-11-07 NOTE — Progress Notes (Signed)
COVID swab appointment: n/a  COVID Vaccine Completed: Date COVID Vaccine completed: Has received booster: COVID vaccine manufacturer: Pfizer    [employer]   Date of COVID positive in last 90 days:  PCP - Berdie Ogren, MD Cardiologist -   Chest x-ray -  EKG -  Stress Test -  ECHO -  Cardiac Cath -  Pacemaker/ICD device last checked: Spinal Cord Stimulator:  Sleep Study -  CPAP -   Fasting Blood Sugar -  Checks Blood Sugar _____ times a day  Blood Thinner Instructions: Aspirin Instructions: Last Dose:  Activity level:  Can go up a flight of stairs and perform activities of daily living without stopping and without symptoms of chest pain or shortness of breath.   Able to exercise without symptoms  Unable to go up a flight of stairs without symptoms of      Anesthesia review:   Patient denies shortness of breath, fever, cough and chest pain at PAT appointment   Patient verbalized understanding of instructions that were given to them at the PAT appointment. Patient was also instructed that they will need to review over the PAT instructions again at home before surgery.

## 2021-11-09 ENCOUNTER — Encounter (HOSPITAL_COMMUNITY)
Admission: RE | Admit: 2021-11-09 | Discharge: 2021-11-09 | Disposition: A | Discharge status: Home / Self Care | Payer: BC Managed Care – PPO | Source: Ambulatory Visit | Attending: Orthopedic Surgery | Admitting: Orthopedic Surgery

## 2021-11-09 ENCOUNTER — Encounter (HOSPITAL_COMMUNITY): Payer: Self-pay

## 2021-11-09 VITALS — BP 137/95 | HR 63 | Temp 97.8°F | Resp 16 | Ht 65.0 in | Wt 181.0 lb

## 2021-11-09 DIAGNOSIS — Z01818 Encounter for other preprocedural examination: Secondary | ICD-10-CM | POA: Insufficient documentation

## 2021-11-09 DIAGNOSIS — L309 Dermatitis, unspecified: Secondary | ICD-10-CM | POA: Diagnosis not present

## 2021-11-09 HISTORY — DX: Gastro-esophageal reflux disease without esophagitis: K21.9

## 2021-11-09 HISTORY — DX: Palpitations: R00.2

## 2021-11-09 HISTORY — DX: Anemia, unspecified: D64.9

## 2021-11-13 NOTE — Anesthesia Preprocedure Evaluation (Addendum)
Anesthesia Evaluation  Patient identified by MRN, date of birth, ID band Patient awake    Reviewed: Allergy & Precautions, NPO status , Patient's Chart, lab work & pertinent test results  Airway Mallampati: II  TM Distance: >3 FB Neck ROM: Full    Dental no notable dental hx. (+) Teeth Intact, Dental Advisory Given   Pulmonary asthma ,    Pulmonary exam normal breath sounds clear to auscultation       Cardiovascular Normal cardiovascular exam Rhythm:Regular Rate:Normal     Neuro/Psych Anxiety Depression negative neurological ROS     GI/Hepatic Neg liver ROS, GERD  ,  Endo/Other  negative endocrine ROS  Renal/GU negative Renal ROS     Musculoskeletal negative musculoskeletal ROS (+)   Abdominal (+) + obese (BMI 30.12),   Peds  Hematology   Anesthesia Other Findings NKDA ALL Latex  Reproductive/Obstetrics negative OB ROS                            Anesthesia Physical Anesthesia Plan  ASA: 2  Anesthesia Plan: General   Post-op Pain Management: Dilaudid IV   Induction: Intravenous  PONV Risk Score and Plan: 4 or greater and Treatment may vary due to age or medical condition, Midazolam, Ondansetron and Dexamethasone  Airway Management Planned: LMA  Additional Equipment: None  Intra-op Plan:   Post-operative Plan:   Informed Consent: I have reviewed the patients History and Physical, chart, labs and discussed the procedure including the risks, benefits and alternatives for the proposed anesthesia with the patient or authorized representative who has indicated his/her understanding and acceptance.     Dental advisory given  Plan Discussed with: CRNA  Anesthesia Plan Comments:        Anesthesia Quick Evaluation

## 2021-11-14 ENCOUNTER — Other Ambulatory Visit: Payer: Self-pay

## 2021-11-14 ENCOUNTER — Observation Stay: Payer: Self-pay

## 2021-11-14 ENCOUNTER — Encounter (HOSPITAL_COMMUNITY): Payer: Self-pay | Admitting: Orthopedic Surgery

## 2021-11-14 ENCOUNTER — Ambulatory Visit (HOSPITAL_COMMUNITY): Payer: BC Managed Care – PPO | Admitting: Certified Registered"

## 2021-11-14 ENCOUNTER — Encounter (HOSPITAL_COMMUNITY)
Admission: RE | Disposition: A | Discharge status: Home / Self Care | Payer: Self-pay | Source: Ambulatory Visit | Attending: Orthopedic Surgery

## 2021-11-14 ENCOUNTER — Observation Stay (HOSPITAL_COMMUNITY)
Admission: RE | Admit: 2021-11-14 | Discharge: 2021-11-15 | Disposition: A | Discharge status: Home / Self Care | Payer: BC Managed Care – PPO | Source: Ambulatory Visit | Attending: Orthopedic Surgery | Admitting: Orthopedic Surgery

## 2021-11-14 DIAGNOSIS — Z91040 Latex allergy status: Secondary | ICD-10-CM | POA: Insufficient documentation

## 2021-11-14 DIAGNOSIS — T847XXA Infection and inflammatory reaction due to other internal orthopedic prosthetic devices, implants and grafts, initial encounter: Secondary | ICD-10-CM

## 2021-11-14 DIAGNOSIS — T8579XA Infection and inflammatory reaction due to other internal prosthetic devices, implants and grafts, initial encounter: Secondary | ICD-10-CM | POA: Diagnosis present

## 2021-11-14 DIAGNOSIS — X58XXXA Exposure to other specified factors, initial encounter: Secondary | ICD-10-CM | POA: Diagnosis not present

## 2021-11-14 DIAGNOSIS — M869 Osteomyelitis, unspecified: Secondary | ICD-10-CM | POA: Diagnosis not present

## 2021-11-14 DIAGNOSIS — J45909 Unspecified asthma, uncomplicated: Secondary | ICD-10-CM | POA: Insufficient documentation

## 2021-11-14 DIAGNOSIS — Z91010 Allergy to peanuts: Secondary | ICD-10-CM | POA: Diagnosis not present

## 2021-11-14 DIAGNOSIS — M009 Pyogenic arthritis, unspecified: Secondary | ICD-10-CM | POA: Diagnosis present

## 2021-11-14 DIAGNOSIS — L409 Psoriasis, unspecified: Secondary | ICD-10-CM

## 2021-11-14 DIAGNOSIS — A4901 Methicillin susceptible Staphylococcus aureus infection, unspecified site: Secondary | ICD-10-CM | POA: Diagnosis not present

## 2021-11-14 DIAGNOSIS — Z01818 Encounter for other preprocedural examination: Secondary | ICD-10-CM

## 2021-11-14 HISTORY — PX: IRRIGATION AND DEBRIDEMENT KNEE: SHX5185

## 2021-11-14 LAB — SEDIMENTATION RATE: Sed Rate: 6 mm/hr (ref 0–22)

## 2021-11-14 LAB — PREGNANCY, URINE: Preg Test, Ur: NEGATIVE

## 2021-11-14 SURGERY — IRRIGATION AND DEBRIDEMENT KNEE
Anesthesia: General | Site: Knee | Laterality: Left

## 2021-11-14 MED ORDER — CEFAZOLIN SODIUM-DEXTROSE 2-4 GM/100ML-% IV SOLN
2.0000 g | Freq: Four times a day (QID) | INTRAVENOUS | Status: AC
  Administered 2021-11-14 – 2021-11-15 (×3): 2 g via INTRAVENOUS
  Filled 2021-11-14 (×3): qty 100

## 2021-11-14 MED ORDER — ACETAMINOPHEN 10 MG/ML IV SOLN: 1000.0000 mg | Freq: Once | INTRAVENOUS | Status: DC | PRN

## 2021-11-14 MED ORDER — HYDROMORPHONE HCL 1 MG/ML IJ SOLN
0.2500 mg | INTRAMUSCULAR | Status: DC | PRN
  Administered 2021-11-14 (×2): 0.5 mg via INTRAVENOUS

## 2021-11-14 MED ORDER — DOCUSATE SODIUM 100 MG PO CAPS
100.0000 mg | ORAL_CAPSULE | Freq: Two times a day (BID) | ORAL | Status: DC
  Administered 2021-11-14 – 2021-11-15 (×2): 100 mg via ORAL
  Filled 2021-11-14 (×2): qty 1

## 2021-11-14 MED ORDER — HYDROMORPHONE HCL 1 MG/ML IJ SOLN: 0.5000 mg | INTRAMUSCULAR | Status: DC | PRN

## 2021-11-14 MED ORDER — LIDOCAINE 2% (20 MG/ML) 5 ML SYRINGE
INTRAMUSCULAR | Status: DC | PRN
  Administered 2021-11-14: 100 mg via INTRAVENOUS

## 2021-11-14 MED ORDER — ONDANSETRON HCL 4 MG/2ML IJ SOLN: 4.0000 mg | Freq: Four times a day (QID) | INTRAMUSCULAR | Status: DC | PRN

## 2021-11-14 MED ORDER — FENTANYL CITRATE (PF) 100 MCG/2ML IJ SOLN
INTRAMUSCULAR | Status: AC
  Filled 2021-11-14: qty 2

## 2021-11-14 MED ORDER — PROPOFOL 10 MG/ML IV BOLUS
INTRAVENOUS | Status: AC
  Filled 2021-11-14: qty 20

## 2021-11-14 MED ORDER — ONDANSETRON HCL 4 MG/2ML IJ SOLN: 4.0000 mg | Freq: Once | INTRAMUSCULAR | Status: DC | PRN

## 2021-11-14 MED ORDER — VANCOMYCIN HCL 1000 MG IV SOLR
INTRAVENOUS | Status: AC
  Filled 2021-11-14: qty 20

## 2021-11-14 MED ORDER — OXYCODONE HCL 5 MG PO TABS: 5.0000 mg | ORAL_TABLET | Freq: Once | ORAL | Status: DC | PRN

## 2021-11-14 MED ORDER — ACETAMINOPHEN 325 MG PO TABS: 325.0000 mg | ORAL_TABLET | Freq: Four times a day (QID) | ORAL | Status: DC | PRN

## 2021-11-14 MED ORDER — DEXMEDETOMIDINE (PRECEDEX) IN NS 20 MCG/5ML (4 MCG/ML) IV SYRINGE
PREFILLED_SYRINGE | INTRAVENOUS | Status: DC | PRN
  Administered 2021-11-14: 12 ug via INTRAVENOUS

## 2021-11-14 MED ORDER — HYDROMORPHONE HCL 1 MG/ML IJ SOLN
INTRAMUSCULAR | Status: AC
  Filled 2021-11-14: qty 1

## 2021-11-14 MED ORDER — CHLORHEXIDINE GLUCONATE CLOTH 2 % EX PADS
6.0000 | MEDICATED_PAD | Freq: Every day | CUTANEOUS | Status: DC
  Administered 2021-11-15: 10:00:00 6 via TOPICAL

## 2021-11-14 MED ORDER — ONDANSETRON HCL 4 MG/2ML IJ SOLN
INTRAMUSCULAR | Status: AC
  Filled 2021-11-14: qty 2

## 2021-11-14 MED ORDER — HYDROCERIN EX CREA
TOPICAL_CREAM | Freq: Every day | CUTANEOUS | Status: DC
  Filled 2021-11-14: qty 113

## 2021-11-14 MED ORDER — PROPOFOL 10 MG/ML IV BOLUS
INTRAVENOUS | Status: DC | PRN
  Administered 2021-11-14: 100 mg via INTRAVENOUS
  Administered 2021-11-14: 50 mg via INTRAVENOUS

## 2021-11-14 MED ORDER — LACTATED RINGERS IV SOLN: INTRAVENOUS | Status: DC

## 2021-11-14 MED ORDER — CEFAZOLIN SODIUM-DEXTROSE 2-4 GM/100ML-% IV SOLN
2.0000 g | INTRAVENOUS | Status: AC
  Administered 2021-11-14: 13:00:00 2 g via INTRAVENOUS
  Filled 2021-11-14: qty 100

## 2021-11-14 MED ORDER — MIDAZOLAM HCL 2 MG/2ML IJ SOLN
INTRAMUSCULAR | Status: DC | PRN
  Administered 2021-11-14: 2 mg via INTRAVENOUS

## 2021-11-14 MED ORDER — SODIUM CHLORIDE 0.9% FLUSH
10.0000 mL | Freq: Two times a day (BID) | INTRAVENOUS | Status: DC
Start: 2021-11-14 — End: 2021-11-15
  Administered 2021-11-15: 10:00:00 10 mL

## 2021-11-14 MED ORDER — OXYCODONE HCL 5 MG/5ML PO SOLN: 5.0000 mg | Freq: Once | ORAL | Status: DC | PRN

## 2021-11-14 MED ORDER — METOCLOPRAMIDE HCL 5 MG/ML IJ SOLN: 5.0000 mg | Freq: Three times a day (TID) | INTRAMUSCULAR | Status: DC | PRN

## 2021-11-14 MED ORDER — FENTANYL CITRATE (PF) 100 MCG/2ML IJ SOLN
INTRAMUSCULAR | Status: DC | PRN
  Administered 2021-11-14: 25 ug via INTRAVENOUS
  Administered 2021-11-14: 50 ug via INTRAVENOUS
  Administered 2021-11-14: 25 ug via INTRAVENOUS
  Administered 2021-11-14: 100 ug via INTRAVENOUS
  Administered 2021-11-14: 50 ug via INTRAVENOUS
  Administered 2021-11-14 (×2): 25 ug via INTRAVENOUS

## 2021-11-14 MED ORDER — SODIUM CHLORIDE 0.9% FLUSH: 10.0000 mL | INTRAVENOUS | Status: DC | PRN

## 2021-11-14 MED ORDER — ONDANSETRON HCL 4 MG PO TABS: 4.0000 mg | ORAL_TABLET | Freq: Four times a day (QID) | ORAL | Status: DC | PRN

## 2021-11-14 MED ORDER — ONDANSETRON HCL 4 MG/2ML IJ SOLN
INTRAMUSCULAR | Status: DC | PRN
  Administered 2021-11-14: 4 mg via INTRAVENOUS

## 2021-11-14 MED ORDER — SODIUM CHLORIDE 0.9 % IV SOLN: INTRAVENOUS | Status: DC

## 2021-11-14 MED ORDER — MIDAZOLAM HCL 2 MG/2ML IJ SOLN
INTRAMUSCULAR | Status: AC
  Filled 2021-11-14: qty 2

## 2021-11-14 MED ORDER — DEXAMETHASONE SODIUM PHOSPHATE 10 MG/ML IJ SOLN
INTRAMUSCULAR | Status: DC | PRN
Start: 2021-11-14 — End: 2021-11-14
  Administered 2021-11-14: 4 mg via INTRAVENOUS

## 2021-11-14 MED ORDER — ACETAMINOPHEN 500 MG PO TABS
1000.0000 mg | ORAL_TABLET | Freq: Four times a day (QID) | ORAL | Status: AC
  Administered 2021-11-14 – 2021-11-15 (×4): 1000 mg via ORAL
  Filled 2021-11-14 (×4): qty 2

## 2021-11-14 MED ORDER — SERTRALINE HCL 100 MG PO TABS
100.0000 mg | ORAL_TABLET | Freq: Every day | ORAL | Status: DC
  Administered 2021-11-14: 23:00:00 100 mg via ORAL
  Filled 2021-11-14: qty 1

## 2021-11-14 MED ORDER — DEXAMETHASONE SODIUM PHOSPHATE 10 MG/ML IJ SOLN
INTRAMUSCULAR | Status: AC
  Filled 2021-11-14: qty 1

## 2021-11-14 MED ORDER — 0.9 % SODIUM CHLORIDE (POUR BTL) OPTIME
TOPICAL | Status: DC | PRN
  Administered 2021-11-14: 13:00:00 1000 mL

## 2021-11-14 MED ORDER — CHLORHEXIDINE GLUCONATE 0.12 % MT SOLN
15.0000 mL | Freq: Once | OROMUCOSAL | Status: AC
  Administered 2021-11-14: 10:00:00 15 mL via OROMUCOSAL

## 2021-11-14 MED ORDER — AMISULPRIDE (ANTIEMETIC) 5 MG/2ML IV SOLN: 10.0000 mg | Freq: Once | INTRAVENOUS | Status: DC | PRN

## 2021-11-14 MED ORDER — METOCLOPRAMIDE HCL 5 MG PO TABS: 5.0000 mg | ORAL_TABLET | Freq: Three times a day (TID) | ORAL | Status: DC | PRN

## 2021-11-14 MED ORDER — SODIUM CHLORIDE 0.9 % IR SOLN
Status: DC | PRN
  Administered 2021-11-14: 2000 mL

## 2021-11-14 MED ORDER — OXYCODONE HCL 5 MG PO TABS
10.0000 mg | ORAL_TABLET | ORAL | Status: DC | PRN
  Administered 2021-11-15 (×2): 15 mg via ORAL
  Administered 2021-11-15: 06:00:00 10 mg via ORAL
  Filled 2021-11-14 (×2): qty 3

## 2021-11-14 MED ORDER — OXYCODONE HCL 5 MG PO TABS
5.0000 mg | ORAL_TABLET | ORAL | Status: DC | PRN
  Administered 2021-11-14 (×3): 10 mg via ORAL
  Filled 2021-11-14 (×4): qty 2

## 2021-11-14 MED ORDER — VANCOMYCIN HCL 1000 MG IV SOLR
INTRAVENOUS | Status: DC | PRN
  Administered 2021-11-14: 1000 mg via TOPICAL

## 2021-11-14 MED ORDER — ORAL CARE MOUTH RINSE: 15.0000 mL | Freq: Once | OROMUCOSAL | Status: AC

## 2021-11-14 SURGICAL SUPPLY — 50 items
BAG COUNTER SPONGE SURGICOUNT (BAG) IMPLANT
BAG SURGICOUNT SPONGE COUNTING (BAG)
BANDAGE ESMARK 6X9 LF (GAUZE/BANDAGES/DRESSINGS) ×1 IMPLANT
BNDG ELASTIC 6X5.8 VLCR STR LF (GAUZE/BANDAGES/DRESSINGS) ×3 IMPLANT
BNDG ESMARK 6X9 LF (GAUZE/BANDAGES/DRESSINGS) ×3
BNDG GAUZE ELAST 4 BULKY (GAUZE/BANDAGES/DRESSINGS) ×2 IMPLANT
COVER SURGICAL LIGHT HANDLE (MISCELLANEOUS) ×3 IMPLANT
CUFF TOURN SGL QUICK 34 (TOURNIQUET CUFF) ×2
CUFF TRNQT CYL 34X4.125X (TOURNIQUET CUFF) ×1 IMPLANT
DRAPE POUCH INSTRU U-SHP 10X18 (DRAPES) ×3 IMPLANT
DRAPE SHEET LG 3/4 BI-LAMINATE (DRAPES) ×3 IMPLANT
DRAPE U-SHAPE 47X51 STRL (DRAPES) ×3 IMPLANT
DRESSING MEPILEX FLEX 4X4 (GAUZE/BANDAGES/DRESSINGS) IMPLANT
DRSG ADAPTIC 3X8 NADH LF (GAUZE/BANDAGES/DRESSINGS) ×3 IMPLANT
DRSG MEPILEX FLEX 4X4 (GAUZE/BANDAGES/DRESSINGS) ×3
DRSG PAD ABDOMINAL 8X10 ST (GAUZE/BANDAGES/DRESSINGS) ×3 IMPLANT
DURAPREP 26ML APPLICATOR (WOUND CARE) ×3 IMPLANT
ELECT REM PT RETURN 15FT ADLT (MISCELLANEOUS) ×3 IMPLANT
EVACUATOR 1/8 PVC DRAIN (DRAIN) ×3 IMPLANT
FACESHIELD WRAPAROUND (MASK) ×15 IMPLANT
FACESHIELD WRAPAROUND OR TEAM (MASK) ×5 IMPLANT
GAUZE SPONGE 4X4 12PLY STRL (GAUZE/BANDAGES/DRESSINGS) ×4 IMPLANT
GLOVE SRG 8 PF TXTR STRL LF DI (GLOVE) ×2 IMPLANT
GLOVE SURG NEOP MICRO LF SZ7.5 (GLOVE) ×6 IMPLANT
GLOVE SURG UNDER POLY LF SZ8 (GLOVE) ×4
GOWN STRL REUS W/TWL LRG LVL3 (GOWN DISPOSABLE) ×3 IMPLANT
GOWN STRL REUS W/TWL XL LVL3 (GOWN DISPOSABLE) ×6 IMPLANT
HANDPIECE INTERPULSE COAX TIP (DISPOSABLE) ×2
IMMOBILIZER KNEE 20 (SOFTGOODS)
IMMOBILIZER KNEE 20 THIGH 36 (SOFTGOODS) IMPLANT
KIT BASIN OR (CUSTOM PROCEDURE TRAY) ×3 IMPLANT
KIT TURNOVER KIT A (KITS) IMPLANT
MANIFOLD NEPTUNE II (INSTRUMENTS) ×3 IMPLANT
NS IRRIG 1000ML POUR BTL (IV SOLUTION) ×3 IMPLANT
PACK TOTAL JOINT (CUSTOM PROCEDURE TRAY) ×3 IMPLANT
PADDING CAST COTTON 6X4 STRL (CAST SUPPLIES) ×6 IMPLANT
PROTECTOR NERVE ULNAR (MISCELLANEOUS) ×3 IMPLANT
SET HNDPC FAN SPRY TIP SCT (DISPOSABLE) ×1 IMPLANT
SET PAD KNEE POSITIONER (MISCELLANEOUS) ×3 IMPLANT
STAPLER VISISTAT 35W (STAPLE) IMPLANT
SUT ETHILON 2 0 PS N (SUTURE) ×6 IMPLANT
SUT VIC AB 1 CT1 36 (SUTURE) ×9 IMPLANT
SUT VIC AB 2-0 CT1 27 (SUTURE) ×6
SUT VIC AB 2-0 CT1 TAPERPNT 27 (SUTURE) ×3 IMPLANT
SWAB COLLECTION DEVICE MRSA (MISCELLANEOUS) ×3 IMPLANT
SWAB CULTURE ESWAB REG 1ML (MISCELLANEOUS) ×3 IMPLANT
TOWEL OR 17X26 10 PK STRL BLUE (TOWEL DISPOSABLE) ×6 IMPLANT
TRAY FOLEY MTR SLVR 16FR STAT (SET/KITS/TRAYS/PACK) ×3 IMPLANT
WATER STERILE IRR 1000ML POUR (IV SOLUTION) ×3 IMPLANT
WRAP KNEE MAXI GEL POST OP (GAUZE/BANDAGES/DRESSINGS) ×3 IMPLANT

## 2021-11-14 NOTE — Anesthesia Postprocedure Evaluation (Signed)
Anesthesia Post Note  Patient: Cheyenne Schwartz  Procedure(s) Performed: IRRIGATION AND DEBRIDEMENT KNEE AND HARDWARE REMOVAL (Left: Knee)     Patient location during evaluation: PACU Anesthesia Type: General Level of consciousness: awake and alert Pain management: pain level controlled Vital Signs Assessment: post-procedure vital signs reviewed and stable Respiratory status: spontaneous breathing, nonlabored ventilation, respiratory function stable and patient connected to nasal cannula oxygen Cardiovascular status: blood pressure returned to baseline and stable Postop Assessment: no apparent nausea or vomiting Anesthetic complications: no   No notable events documented.  Last Vitals:  Vitals:   11/14/21 1415 11/14/21 1430  BP: (!) 130/93 (!) 127/91  Pulse: 96 93  Resp: (!) 25 (!) 23  Temp:    SpO2: 100% 100%    Last Pain:  Vitals:   11/14/21 1430  PainSc: Asleep                 Trevor Iha

## 2021-11-14 NOTE — H&P (Signed)
A pre op hand p   Chief Complaint: Left knee pain and swelling on the anterior medial aspect of the patella  HPI: Cheyenne Schwartz is a 25 y.o. female who presents for evaluation of left knee pain and swelling of the anteromedial aspect of the patella. It has been present for couple of months and has been worsening. She has failed conservative measures. Pain is rated as moderate.  Past Medical History:  Diagnosis Date   Anemia    iron   Anxiety    Asthma    Eczema    GERD (gastroesophageal reflux disease)    Palpitations    Past Surgical History:  Procedure Laterality Date   KNEE ARTHROSCOPY Left    UPPER GI ENDOSCOPY     WISDOM TOOTH EXTRACTION     Social History   Socioeconomic History   Marital status: Single    Spouse name: Not on file   Number of children: Not on file   Years of education: Not on file   Highest education level: Not on file  Occupational History   Not on file  Tobacco Use   Smoking status: Never   Smokeless tobacco: Never  Vaping Use   Vaping Use: Never used  Substance and Sexual Activity   Alcohol use: Yes    Alcohol/week: 4.0 standard drinks    Types: 4 Standard drinks or equivalent per week   Drug use: Never   Sexual activity: Not Currently  Other Topics Concern   Not on file  Social History Narrative   Not on file   Social Determinants of Health   Financial Resource Strain: Not on file  Food Insecurity: Not on file  Transportation Needs: Not on file  Physical Activity: Not on file  Stress: Not on file  Social Connections: Not on file   History reviewed. No pertinent family history. Allergies  Allergen Reactions   Chocolate Flavor Hives and Itching   Cocoa Hives   Latex Hives and Itching   Other Hives and Itching    runny nose and Itchy throat   Apple Itching    Itchy throat    Fish Allergy Itching   Peanut Oil Itching    Itchy Throat    Bee Pollen Other (See Comments)    Sneezing; runny nose   Pollen Extract     Shellfish Allergy Itching    Itchy throat   Soy Allergy Itching   Banana Itching    Itchy Throat    Prior to Admission medications   Medication Sig Start Date End Date Taking? Authorizing Provider  acetaminophen (TYLENOL) 500 MG tablet Take 1,000 mg by mouth every 6 (six) hours as needed for moderate pain.   Yes [provider]  Ascorbic Acid (VITAMIN C GUMMIE PO) Take 2 tablets by mouth daily.   Yes [provider]  cephALEXin (KEFLEX) 500 MG capsule Take by mouth 3 (three) times daily. Patient unsure of dosage   Yes [provider]  cetirizine (ZYRTEC) 10 MG tablet Take 10 mg by mouth daily as needed for allergies. 12/08/08  Yes [provider]  EPINEPHrine 0.3 mg/0.3 mL IJ SOAJ injection SMARTSIG:1 Injection IM PRN 05/16/21  Yes [provider]  ferrous sulfate 325 (65 FE) MG tablet Take 325 mg by mouth daily with breakfast.   Yes [provider]  PROAIR RESPICLICK 108 (90 Base) MCG/ACT AEPB Inhale 2 puffs into the lungs every 6 (six) hours as needed (SOB, wheezing).  10/30/18  Yes [provider]  sertraline (ZOLOFT) 100 MG tablet Take 1 tablet (100 mg total) by mouth at bedtime. For mood 01/21/19  Yes Aldean Baker, NP  triamcinolone ointment (KENALOG) 0.1 % Apply 1 application topically 2 (two) times daily as needed (eczema).   Yes [provider]  DUPIXENT 300 MG/2ML prefilled syringe Inject 300 mg into the skin every 14 (fourteen) days. 11/01/21   [provider]  hydrOXYzine (ATARAX/VISTARIL) 25 MG tablet Take 1 tablet (25 mg total) by mouth every 8 (eight) hours as needed for anxiety or itching. 01/21/19   Aldean Baker, NP  naproxen (NAPROSYN) 500 MG tablet Take 1 tablet (500 mg total) by mouth 2 (two) times daily as needed for moderate pain. Patient not taking: Reported on 11/04/2021 02/28/21   Petrucelli, Pleas Koch, PA-C  pantoprazole (PROTONIX) 20 MG tablet Take 1 tablet (20 mg total) by mouth  daily. Patient not taking: Reported on 11/04/2021 12/08/18   Bethel Born, PA-C     Positive ROS: None  All other systems have been reviewed and were otherwise negative with the exception of those mentioned in the HPI and as above.  Physical Exam: Vitals:   11/14/21 0940  BP: (!) 132/100  Pulse: 81  Resp: 18  Temp: 98 F (36.7 C)  SpO2: 98%    General: Alert, no acute distress Cardiovascular: No pedal edema Respiratory: No cyanosis, no use of accessory musculature GI: No organomegaly, abdomen is soft and non-tender Skin: No lesions in the area of chief complaint Neurologic: Sensation intact distally Psychiatric: Patient is competent for consent with normal mood and affect Lymphatic: No axillary or cervical lymphadenopathy  MUSCULOSKELETAL: Left knee tender to palpation of the anterior aspect.  Mild drainage from the superior and inferior aspects of the incision.  Trace effusion in the left knee today.  Assessment/Plan: LEFT KNEE INFECTION OVER MEDIAL PATELLOFEMORAL LIGAMENT IMPLANT and if there is any concern for knee infection she may need I&D of the knee and potential admission for long-term antibiotic treatment. Plan for Procedure(s): IRRIGATION AND DEBRIDEMENT KNEE AND HARDWARE REMOVAL  The risks benefits and alternatives were discussed with the patient including but not limited to the risks of nonoperative treatment, versus surgical intervention including infection, bleeding, nerve injury, malunion, nonunion, hardware prominence, hardware failure, need for hardware removal, blood clots, cardiopulmonary complications, morbidity, mortality, among others, and they were willing to proceed.  Predicted outcome is good, although there will be at least a six to nine month expected recovery.  Harvie Junior, MD 11/14/2021 11:33 AM

## 2021-11-14 NOTE — Progress Notes (Signed)
This is information of importance related to her surgery.  I was able to remove all of the foreign material from the wound.  I do not think I opened the knee joint and it did not appear as though the infection tracked into the knee joint.  There were anchors and 2 drill holes in the patella and it appeared that the infection emanated from the superior drill hole.  That anchor was completely removed and the bone in the drill hole was curetted aggressively.  I was able to remove the anchor from the bottom drill hole as well.  I put vancomycin powder into the drill holes and into the wound.  I put a small Penrose drain in the wound as well.  I will plan on seeing her in the office on Thursday to remove the Penrose drain.  She will keep the dressing intact until that time.  Jodi Geralds [phone]

## 2021-11-14 NOTE — Anesthesia Procedure Notes (Signed)
Procedure Name: LMA Insertion Date/Time: 11/14/2021 12:11 PM Performed by: Sindy Guadeloupe, CRNA Pre-anesthesia Checklist: Patient identified, Emergency Drugs available, Suction available, Patient being monitored and Timeout performed Patient Re-evaluated:Patient Re-evaluated prior to induction Oxygen Delivery Method: Circle system utilized Preoxygenation: Pre-oxygenation with 100% oxygen Induction Type: IV induction Ventilation: Mask ventilation without difficulty LMA: LMA inserted LMA Size: 4.0 Number of attempts: 1 Tube secured with: Tape Dental Injury: Teeth and Oropharynx as per pre-operative assessment

## 2021-11-14 NOTE — Progress Notes (Signed)
Peripherally Inserted Central Catheter Placement  The IV Nurse has discussed with the patient and/or persons authorized to consent for the patient, the purpose of this procedure and the potential benefits and risks involved with this procedure.  The benefits include less needle sticks, lab draws from the catheter, and the patient may be discharged home with the catheter. Risks include, but not limited to, infection, bleeding, blood clot (thrombus formation), and puncture of an artery; nerve damage and irregular heartbeat and possibility to perform a PICC exchange if needed/ordered by physician.  Alternatives to this procedure were also discussed.  Bard Power PICC patient education guide, fact sheet on infection prevention and patient information card has been provided to patient /or left at bedside.   Consent obtained with father at bedside   PICC Placement Documentation  PICC Single Lumen 11/14/21 Right Basilic 40 cm 0 cm (Active)  Indication for Insertion or Continuance of Line Prolonged intravenous therapies 11/14/21 1600  Exposed Catheter (cm) 0 cm 11/14/21 1600  Site Assessment Clean;Intact;Dry 11/14/21 1600  Line Status Flushed;Saline locked;Blood return noted 11/14/21 1600  Dressing Type Transparent;Securing device 11/14/21 1600  Dressing Status Clean;Dry;Intact 11/14/21 1600  Antimicrobial disc in place? Yes 11/14/21 1600  Safety Lock Not Applicable 11/14/21 1600  Line Care Connections checked and tightened 11/14/21 1600  Dressing Intervention New dressing 11/14/21 1600  Dressing Change Due 11/21/21 11/14/21 1600       Franne Grip Renee 11/14/2021, 5:00 PM

## 2021-11-14 NOTE — Plan of Care (Signed)
  Problem: Education: Goal: Knowledge of General Education information will improve Description: Including pain rating scale, medication(s)/side effects and non-pharmacologic comfort measures Outcome: Progressing   Problem: Nutrition: Goal: Adequate nutrition will be maintained Outcome: Progressing   Problem: Coping: Goal: Level of anxiety will decrease Outcome: Progressing   

## 2021-11-14 NOTE — Brief Op Note (Signed)
11/14/2021  1:32 PM  PATIENT:  Cheyenne Schwartz  25 y.o. female  PRE-OPERATIVE DIAGNOSIS:  LEFT KNEE INFECTION OVER MEDIAL PATELLOFEMORAL LIGAMENT IMPLANT  POST-OPERATIVE DIAGNOSIS:  LEFT KNEE INFECTION OVER MEDIAL PATELLOFEMORAL LIGAMENT IMPLANT  PROCEDURE:  Procedure(s): IRRIGATION AND DEBRIDEMENT KNEE AND HARDWARE REMOVAL (Left)  SURGEON:  Surgeon(s) and Role:    Jodi Geralds, MD - Primary  PHYSICIAN ASSISTANT:   ASSISTANTS: Barkley Boards   ANESTHESIA:   general  EBL:  10 mL   BLOOD ADMINISTERED:none  DRAINS: Penrose drain in the medial patellar wound    LOCAL MEDICATIONS USED:  NONE  SPECIMEN:  Source of Specimen:  tissue and skin from medial patellar wound  DISPOSITION OF SPECIMEN:  PATHOLOGY  COUNTS:  YES  TOURNIQUET:  * Missing tourniquet times found for documented tourniquets in log: [id number] *  DICTATION: .Other Dictation: Dictation Number [id number]  PLAN OF CARE: Admit for overnight observation  PATIENT DISPOSITION:  PACU - hemodynamically stable.   Delay start of Pharmacological VTE agent (>24hrs) due to surgical blood loss or risk of bleeding: no

## 2021-11-14 NOTE — Progress Notes (Signed)
The patient takes significant medications for extreme eczema.

## 2021-11-14 NOTE — Transfer of Care (Signed)
Immediate Anesthesia Transfer of Care Note  Patient: Cheyenne Schwartz  Procedure(s) Performed: IRRIGATION AND DEBRIDEMENT KNEE AND HARDWARE REMOVAL (Left: Knee)  Patient Location: PACU  Anesthesia Type:General  Level of Consciousness: awake, alert  and patient cooperative  Airway & Oxygen Therapy: Patient Spontanous Breathing and Patient connected to face mask oxygen  Post-op Assessment: Report given to RN and Post -op Vital signs reviewed and stable  Post vital signs: Reviewed and stable  Last Vitals:  Vitals Value Taken Time  BP 148/100 11/14/21 1340  Temp    Pulse 62 11/14/21 1340  Resp 12 11/14/21 1342  SpO2 84 % 11/14/21 1340  Vitals shown include unvalidated device data.  Last Pain:  Vitals:   11/14/21 0957  PainSc: 6       Patients Stated Pain Goal: 3 (11/14/21 0957)  Complications: No notable events documented.

## 2021-11-15 ENCOUNTER — Encounter (HOSPITAL_COMMUNITY): Payer: Self-pay | Admitting: Orthopedic Surgery

## 2021-11-15 DIAGNOSIS — T8579XA Infection and inflammatory reaction due to other internal prosthetic devices, implants and grafts, initial encounter: Secondary | ICD-10-CM | POA: Diagnosis not present

## 2021-11-15 DIAGNOSIS — A4901 Methicillin susceptible Staphylococcus aureus infection, unspecified site: Secondary | ICD-10-CM | POA: Diagnosis not present

## 2021-11-15 DIAGNOSIS — Z01818 Encounter for other preprocedural examination: Secondary | ICD-10-CM

## 2021-11-15 DIAGNOSIS — M869 Osteomyelitis, unspecified: Secondary | ICD-10-CM | POA: Diagnosis not present

## 2021-11-15 DIAGNOSIS — T847XXA Infection and inflammatory reaction due to other internal orthopedic prosthetic devices, implants and grafts, initial encounter: Secondary | ICD-10-CM

## 2021-11-15 DIAGNOSIS — M009 Pyogenic arthritis, unspecified: Secondary | ICD-10-CM

## 2021-11-15 DIAGNOSIS — L409 Psoriasis, unspecified: Secondary | ICD-10-CM

## 2021-11-15 LAB — COMPREHENSIVE METABOLIC PANEL
ALT: 12 U/L (ref 0–44)
AST: 16 U/L (ref 15–41)
Albumin: 3.1 g/dL — ABNORMAL LOW (ref 3.5–5.0)
Alkaline Phosphatase: 54 U/L (ref 38–126)
Anion gap: 6 (ref 5–15)
BUN: 9 mg/dL (ref 6–20)
CO2: 26 mmol/L (ref 22–32)
Calcium: 8.3 mg/dL — ABNORMAL LOW (ref 8.9–10.3)
Chloride: 106 mmol/L (ref 98–111)
Creatinine, Ser: 0.66 mg/dL (ref 0.44–1.00)
GFR, Estimated: >60 mL/min (ref >60)
Glucose, Bld: 120 mg/dL — ABNORMAL HIGH (ref 70–99)
Potassium: 3.5 mmol/L (ref 3.5–5.1)
Sodium: 138 mmol/L (ref 135–145)
Total Bilirubin: 0.3 mg/dL (ref 0.3–1.2)
Total Protein: 6.6 g/dL (ref 6.5–8.1)

## 2021-11-15 LAB — CBC
HCT: 33.2 % — ABNORMAL LOW (ref 36.0–46.0)
Hemoglobin: 10.7 g/dL — ABNORMAL LOW (ref 12.0–15.0)
MCH: 27.1 pg (ref 26.0–34.0)
MCHC: 32.2 g/dL (ref 30.0–36.0)
MCV: 84.1 fL (ref 80.0–100.0)
Platelets: 420 10*3/uL — ABNORMAL HIGH (ref 150–400)
RBC: 3.95 MIL/uL (ref 3.87–5.11)
RDW: 12.7 % (ref 11.5–15.5)
WBC: 9.3 10*3/uL (ref 4.0–10.5)
nRBC: 0 % (ref 0.0–0.2)

## 2021-11-15 LAB — HIV ANTIBODY (ROUTINE TESTING W REFLEX): HIV Screen 4th Generation wRfx: NONREACTIVE

## 2021-11-15 LAB — C-REACTIVE PROTEIN: CRP: 0.7 mg/dL (ref <1.0)

## 2021-11-15 LAB — HEPATITIS C ANTIBODY: HCV Ab: NONREACTIVE

## 2021-11-15 MED ORDER — CEFAZOLIN SODIUM-DEXTROSE 2-4 GM/100ML-% IV SOLN
2.0000 g | Freq: Three times a day (TID) | INTRAVENOUS | Status: DC
  Administered 2021-11-15: 13:00:00 2 g via INTRAVENOUS

## 2021-11-15 MED ORDER — CEFAZOLIN IV (FOR PTA / DISCHARGE USE ONLY): 2.0000 g | Freq: Three times a day (TID) | INTRAVENOUS | 0 refills | Status: AC

## 2021-11-15 MED ORDER — HYDROCODONE-ACETAMINOPHEN 5-325 MG PO TABS: 1.0000 | ORAL_TABLET | Freq: Four times a day (QID) | ORAL | 0 refills | Status: DC | PRN

## 2021-11-15 MED ORDER — HEPARIN SOD (PORK) LOCK FLUSH 100 UNIT/ML IV SOLN
250.0000 [IU] | INTRAVENOUS | Status: AC | PRN
  Administered 2021-11-15: 15:00:00 250 [IU]
  Filled 2021-11-15: qty 2.5

## 2021-11-15 NOTE — TOC Initial Note (Addendum)
Transition of Care [facility]) - Initial/Assessment Note    Patient Details  Name: Cheyenne Schwartz MRN: [id number] Date of Birth: 1996/05/29  [facility]) CM/SW Contact:    Lennart Pall, LCSW Phone Number: 11/15/2021, 12:51 PM  Clinical Narrative:                 Met with pt and father today to introduce self/ TOC role in dc planning.  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).  Her father lives in [place] (address of record for the pt).  Pt notes plan to possibly move between these two locations for the next few weeks while she is receiving IV abx.  They understand that I will assist with set up of IV abx and HHRN. No agency preferences.  Have alerted Carolynn Sayers, RN with [employer] (home infusion) and will work to [place] coverage as well.   ADDENDUM:  Coverage for home abx confirmed with [facility] and [facility] services with [facility].  All teaching of IV abx management completed.  No further TOC needs.  Expected Discharge Plan: [place] Barriers to Discharge: Barriers Resolved   Patient Goals and CMS Choice Patient states their goals for this hospitalization and ongoing recovery are:: return home      Expected Discharge Plan and Services Expected Discharge Plan: [place] In-house Referral: Clinical Social Work     Living arrangements for the past 2 months: Apartment Expected Discharge Date: 11/15/21               DME Arranged: N/A DME Agency: NA       HH Arranged: IV Antibiotics, RN [place] Agency: Other - See comment, [facility]) Date [place]: 11/15/21   Representative spoke with at [place]: [place]  Prior Living Arrangements/Services Living arrangements for the past 2 months: Apartment Lives with:: Self Patient language and need for interpreter reviewed:: Yes Do you feel safe going back to the  place where you live?: Yes      Need for Family Participation in Patient Care: Yes (Comment) Care giver support system in place?: Yes (comment)   Criminal Activity/Legal Involvement Pertinent to Current Situation/Hospitalization: No - Comment as needed  Activities of Daily Living Home Assistive Devices/Equipment: Eyeglasses, Crutches, Brace (specify type) ADL Screening (condition at time of admission) Patient's cognitive ability adequate to safely complete daily activities?: Yes Is the patient deaf or have difficulty hearing?: No Does the patient have difficulty seeing, even when wearing glasses/contacts?: No Does the patient have difficulty concentrating, remembering, or making decisions?: No Patient able to express need for assistance with ADLs?: Yes Does the patient have difficulty dressing or bathing?: No Independently performs ADLs?: Yes (appropriate for developmental age) Does the patient have difficulty walking or climbing stairs?: No Weakness of Legs: None Weakness of Arms/Hands: None  Permission Sought/Granted Permission sought to share information with : Family Supports Permission granted to share information with : Yes, Verbal Permission Granted  Share Information with NAME: Cheyenne Schwartz     Permission granted to share info w Relationship: father  Permission granted to share info w Contact Information: [phone]  Emotional Assessment Appearance:: Appears stated age Attitude/Demeanor/Rapport: Engaged, Gracious Affect (typically observed): Accepting Orientation: : Oriented to Self, Oriented to Place, Oriented to  Time, Oriented to Situation Alcohol / Substance Use: Not Applicable Psych Involvement: No (comment)  Admission diagnosis:  Infection of left knee [facility]) [M00.9] Patient Active Problem List   Diagnosis Date Noted   Infection of left knee ([place]) 11/14/2021   Severe recurrent major depression without psychotic features ([place]) 01/19/2019   PCP:  System,  Provider Not In Pharmacy:   [id number] [id number]: [phone]: [phone]    Social Determinants of Health (SDOH) Interventions    Readmission Risk Interventions No flowsheet data found.

## 2021-11-15 NOTE — Op Note (Signed)
Cheyenne Schwartz, STANZIONE MEDICAL RECORD NO: [id number] ACCOUNT NO: [id number] DATE OF BIRTH: 1996/08/07 FACILITY: [facility]ATION: [id number] PHYSICIAN: Harvie Junior, MD  Operative Report   DATE OF PROCEDURE: 11/14/2021  IDENTIFICATION: The patient is a 25 year old female in the Orthopedic Surgery service.  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  POSTOPERATIVE DIAGNOSIS:  Infected MPFL reconstruction in the medial patellar wound.  PRINCIPAL PROCEDURES:  1. Removal of hardware from the medial patellar wound including SwiveLocks and suture. 2. Removal of femoral side screw from a separate incision. 3. Irrigation and debridement of wound with curettage of the patellar drill holes and extensive irrigation and installation of vancomycin powder within the wound and closure over a Penrose drain.  SURGEON:  Dr. Luiz Blare.  ASSISTANT:  Stanford Breed.  ANESTHESIA:  General.  BRIEF HISTORY: The patient is a 25 year old female with a history of having had a significant patellar subluxation with multiple episodes.  After failure of conservative care including physical therapy and time, she was ultimately taken to the operating  room for medial patellofemoral ligament reconstruction.  She did well postoperatively and felt great and was back into exercising and working out and unfortunately developed what I thought was a superficial infection around her medial patellar wound.  It  was aspirated and grew Staph that was sensitive and we put her on Keflex and it resolved.  This happened again and we again aspirated and put her on Keflex And that seemed to have resolved.  Unfortunately, at third time she began spontaneously draining and  at that point, we felt that we needed to go in and remove what we could in terms of material and to see if this was indeed a superficial infection.  She had always behaved like a superficial infection but she had no issues with  range of motion of the  knee joint.  No significant pain.  She did not appear sick.  She was brought to the operating room for this procedure.  DESCRIPTION OF PROCEDURE:  The patient was brought to the operating room.  After adequate anesthesia was obtained with a general anesthetic, the patient was placed on the operating table.  Left leg was prepped and draped in the usual sterile fashion.   Following this, an incision was made over the femoral side.  The subcutaneous tissue was dissected down to the level of the femoral tunnel.  Femoral tunnel was identified and the femoral screw was identified and removed.  This wound was then packed with  a Betadine sponge and covered with a occlusive dressing and attention was then turned towards the medial wound.  At this time, an excision of the wound was undertaken and the skin was removed unfortunately revealing some granulomatous and/or infectious  type tissue below.  We tracked this tissue directly to the superior patellar drill hole and the sutures from the medial retinacular closure were identified.  We went to the drill hole and this actually came out, the superior SwiveLock came out just with  the pull of the suture.  At that point, we went to the inferior of the SwiveLocks and tried to pull this out with a suture.  It was well anchored in bone.  We were able to get an inserter into the SwiveLock and we were able to remove the SwiveLock.  We  then were able to get the deepest portion of the SwiveLock out with a hemostat.  At that point, we debrided the remaining suture that we  saw which had all the granulomatous tissue and other ill-appearing tissue from this patellar wound.  We then  irrigated it thoroughly with a liter of saline irrigation.  We then took Betadine and instilled it through the tissues and into the patellar wound.  I curetted out the patellar drill holes extensively and then Betadine and then irrigated them again with  a liter.  At that  point, we put vancomycin powder in the drill holes as well as vancomycin powder sprinkled in this wound. We then closed the medial patellar wound over a drain.  I then carefully with separate instrumentation, closed the medial femoral  side wound.  Once this was done, a sterile compressive dressing was applied and the patient was taken to recovery and was noted to be in satisfactory condition.  Estimated blood loss for procedure was minimal.  Given the extent and concern for infection and potential of infection that has been in the bone., I felt that infectious disease consultation was appropriate and I did consult them after the case, they do want to admit her and have a discussion about the  possibility of treating her long-term with IV antibiotics.  The patient will obviously be admitted on my service and then I will follow her in the office once when she is discharged from the hospital.    MUK D: 11/14/2021 1:39:48 pm T: 11/15/2021 1:26:00 am  JOB: [id number]/ [id number]

## 2021-11-15 NOTE — Discharge Instructions (Signed)
Discharge Instructions after Knee Surgery   You will have a light dressing on your knee.  Leave dressing on until your first post op appt on thurs After the bandage has been removed you may shower, but do not soak the incision. You may begin gentle motion of your leg immediately after surgery. Pump your foot up and down 20 times per hour, every hour you are awake.  Apply ice to the knee 3 times per day for 30 minutes for the first 1 week until your knee is feeling comfortable again. Do not use heat.  Pain medicine has been prescribed for you.  Use your medicine as needed over the first 48 hours, and then you can begin to taper your use. You may take Extra Strength Tylenol or Tylenol only in place of the pain pills.  Take one 81mg  aspirin daily for 3 weeks post-operatively unless it upsets your stomach.   Please call [phone] during normal business hours or [phone] after hours for any problems. Including the following:  - excessive redness of the incisions - drainage for more than 4 days - fever of more than 101.5 F  *Please note that pain medications will not be refilled after hours or on weekends.

## 2021-11-15 NOTE — Consult Note (Signed)
Date of Admission:  11/14/2021          Reason for Consult: Infected hardware of  MPFL reconstructed ligament with Staphylococcus aureus on culture from Orthopedic Surgery office   Referring Provider:  Jodi Geralds, MD   Assessment:  Infected medial patellofemoral ligament reconstruction status post removal of of hardware Cultures taken from Dr. Luiz Blare office had yielded methicillin sensitive Staph aureus and the patient clinically is responded to Keflex as an outpatient Eczema Asthma  Plan:  Start cefazolin and plan for 6-week course of treatment unless a different pathogen is isolated than the Staphylococcus aureus that was isolated and orthopedic surgery's office Check BMP sed rate CRP HIV and hepatitis C serologies    Cheyenne Schwartz has an appointment on 12/14/2021 at 930 AM with Dr. Thedore Mins at :  [facility] is located in the [facility] at  [address] [place] in [place].  [address], which is located to the left of the elevators.  Phone: [phone]  Fax: [phone]  [email or web address]  She should arrive 30 minutes prior to her appointment.    Principal Problem:   Infection of left knee (HCC)   Scheduled Meds:  Chlorhexidine Gluconate Cloth  6 each Topical Daily   docusate sodium  100 mg Oral BID   hydrocerin   Topical Daily   sertraline  100 mg Oral QHS   sodium chloride flush  10-40 mL Intracatheter Q12H   Continuous Infusions:  sodium chloride Stopped (11/15/21 1305)    ceFAZolin (ANCEF) IV 2 g (11/15/21 1305)   PRN Meds:.acetaminophen, HYDROmorphone (DILAUDID) injection, metoCLOPramide **OR** metoCLOPramide (REGLAN) injection, ondansetron **OR** ondansetron (ZOFRAN) IV, oxyCODONE, oxyCODONE, sodium chloride flush  HPI: Cheyenne Schwartz is a 25 y.o. female with a history of eczema and had patellar subluxation with multiple episodes and failure conservative care now status post  medial patellofemoral ligament reconstruction.  She been doing well postoperatively and has begun to start exercising again.  Unfortunately she then began developing what Dr. Luiz Blare thought was a superficial infection around the medial patellar wound.  This area was aspirated and grew a methicillin sensitive Staphylococcus aureus that responded to cephalexin.  Unfortunately she then had another fluid collection declare itself which again responded to cephalexin.  Now however she again began having a fluid collection that accumulated and began spontaneously draining.  There is concern about her having infection involving her hardware.  He was taken to the operating room yesterday underwent removal of hardware from the medial patellar wound as well as the femoral side screw with irrigation debridement of the wound and curettaged of the patellar drill holes and extensive irrigation with placement of vancomycin powder.  In the operating room there was some granulomatous material found in the patella where the hardware was removed.  The joint itself did not appear infected either in the operating room or clinically on exam prior to surgery.  I was contacted Dr. Luiz Blare who is concerned about potential infection involving the hardware in his in her patella and femur.  Given concern for potential hardware associated infection of patella in particular I felt she would benefit from being given systemic and IV antibiotics and she was initiated on cefazolin yesterday.  We will plan on giving her 6-week course of cefazolin with follow-up in my clinic with my partner Dr. Thedore Mins.  I will follow-up the culture data from the operating room to see if antibiotics need to be adjusted or not.  I spent 82 minutes with the patient including than 50% of the time in face to face counseling of the patient  and her father regarding the nature of hardware associated infections concerns for deeper infection involving  the patella in particular reasons for wanting to give systemic IV antibiotic therapy personally reviewing plain films of the knee updated culture data along with review of medical records in preparation for the visit and during the visit and in coordination of her care.    Review of Systems: Review of Systems  Constitutional:  Negative for chills, diaphoresis, fever, malaise/fatigue and weight loss.  HENT:  Negative for congestion, hearing loss, sore throat and tinnitus.   Eyes:  Negative for blurred vision, double vision and photophobia.  Respiratory:  Negative for cough, sputum production, shortness of breath and wheezing.   Cardiovascular:  Negative for chest pain, palpitations and leg swelling.  Gastrointestinal:  Negative for abdominal pain, blood in stool, constipation, diarrhea, heartburn, melena, nausea and vomiting.  Genitourinary:  Negative for dysuria, flank pain and hematuria.  Musculoskeletal:  Positive for myalgias. Negative for back pain, falls and joint pain.  Skin:  Negative for itching and rash.  Neurological:  Negative for dizziness, sensory change, focal weakness, loss of consciousness, weakness and headaches.  Endo/Heme/Allergies:  Does not bruise/bleed easily.  Psychiatric/Behavioral:  Negative for depression, memory loss and suicidal ideas. The patient is not nervous/anxious and does not have insomnia.    Past Medical History:  Diagnosis Date   Anemia    iron   Anxiety    Asthma    Eczema    GERD (gastroesophageal reflux disease)    Palpitations     Social History   Tobacco Use   Smoking status: Never   Smokeless tobacco: Never  Vaping Use   Vaping Use: Never used  Substance Use Topics   Alcohol use: Yes    Alcohol/week: 4.0 standard drinks    Types: 4 Standard drinks or equivalent per week   Drug use: Never    History reviewed. No pertinent family history. Allergies  Allergen Reactions   Chocolate Flavor Hives and Itching   Cocoa Hives   Latex  Hives and Itching   Other Hives and Itching    runny nose and Itchy throat   Fish Allergy Itching   Peanut Oil Itching    Itchy Throat    Bee Pollen Other (See Comments)    Sneezing; runny nose   Pollen Extract    Shellfish Allergy Itching    Itchy throat   Banana Itching    Itchy Throat     OBJECTIVE: Blood pressure 125/64, pulse 85, temperature 98.7 F (37.1 C), resp. rate 18, height 5\' 5"  (1.651 m), weight 82.1 kg, last menstrual period 10/26/2021, SpO2 100 %.  Physical Exam Constitutional:      General: She is not in acute distress.    Appearance: Normal appearance. She is well-developed. She is not ill-appearing or diaphoretic.  HENT:     Head: Normocephalic and atraumatic.     Right Ear: Hearing and external ear normal.     Left Ear: Hearing and external ear normal.     Nose: No nasal deformity or rhinorrhea.  Eyes:     General: No scleral icterus.    Conjunctiva/sclera: Conjunctivae normal.     Right eye: Right conjunctiva is not injected.     Left eye: Left conjunctiva is not injected.     Pupils: Pupils are equal, round, and reactive to light.  Neck:     Vascular: No JVD.  Cardiovascular:     Rate and Rhythm: Normal rate and regular rhythm.     Heart sounds: Normal heart sounds, S1 normal and S2 normal. No murmur heard.   No friction rub.  Abdominal:     General: Bowel sounds are normal. There is no distension.     Palpations: Abdomen is soft.     Tenderness: There is no abdominal tenderness.  Musculoskeletal:        General: Normal range of motion.     Right shoulder: Normal.     Left shoulder: Normal.     Cervical back: Normal range of motion and neck supple.     Right hip: Normal.     Left hip: Normal.     Right knee: Normal.     Left knee: Normal.  Lymphadenopathy:     Head:     Right side of head: No submandibular, preauricular or posterior auricular adenopathy.     Left side of head: No submandibular, preauricular or posterior auricular  adenopathy.     Cervical: No cervical adenopathy.     Right cervical: No superficial or deep cervical adenopathy.    Left cervical: No superficial or deep cervical adenopathy.  Skin:    General: Skin is warm and dry.     Coloration: Skin is not pale.     Findings: No abrasion, bruising, ecchymosis, erythema, lesion or rash.     Nails: There is no clubbing.  Neurological:     Mental Status: She is alert and oriented to person, place, and time.     Sensory: No sensory deficit.     Coordination: Coordination normal.     Gait: Gait normal.  Psychiatric:        Attention and Perception: She is attentive.        Mood and Affect: Mood normal.        Speech: Speech normal.        Behavior: Behavior normal. Behavior is cooperative.        Thought Content: Thought content normal.        Judgment: Judgment normal.   Knee bandaged  PICC line in place  Lab Results Lab Results  Component Value Date   WBC 9.3 11/15/2021   HGB 10.7 (L) 11/15/2021   HCT 33.2 (L) 11/15/2021   MCV 84.1 11/15/2021   PLT 420 (H) 11/15/2021    Lab Results  Component Value Date   CREATININE 0.74 01/18/2019   BUN 9 01/18/2019   NA 138 01/18/2019   K 3.9 01/18/2019   CL 103 01/18/2019   CO2 25 01/18/2019    Lab Results  Component Value Date   ALT 20 01/18/2019   AST 27 01/18/2019   ALKPHOS 65 01/18/2019   BILITOT 0.6 01/18/2019     Microbiology: Recent Results (from the past 240 hour(s))  Aerobic/Anaerobic Culture w Gram Stain (surgical/deep wound)     Status: None (Preliminary result)   Collection Time: 11/14/21 12:36 PM   Specimen: PATH Soft tissue resection  Result Value Ref Range Status   Specimen Description   Final    TISSUE KNEE LEFT Performed at [facility], [place] [address], [place], [place] [address]    Special Requests   Final    NONE Performed at [facility], [place] [address], [place], [place] [address]    Gram Stain   Final    MODERATE WBC  PRESENT, PREDOMINANTLY MONONUCLEAR NO  ORGANISMS SEEN    Culture   Final    NO GROWTH < 24 HOURS Performed at [facility], [place] [address] [place], [place] [address]    Report Status PENDING  Incomplete  Aerobic/Anaerobic Culture w Gram Stain (surgical/deep wound)     Status: None (Preliminary result)   Collection Time: 11/14/21 12:40 PM   Specimen: PATH Soft tissue resection  Result Value Ref Range Status   Specimen Description   Final    KNEE LEFT STITCHING Performed at [place] [address] [place], [place] [address]    Special Requests   Final    NONE Performed at [facility], [place] [address], [place], [place] [address]    Gram Stain   Final    ABUNDANT WBC PRESENT,BOTH PMN AND MONONUCLEAR NO ORGANISMS SEEN    Culture   Final    RARE STAPHYLOCOCCUS AUREUS SUSCEPTIBILITIES TO FOLLOW Performed at [facility], [place] [address] [place],  [address]    Report Status PENDING  Incomplete    Alcide Evener, [place] [facility] [phone] pager  11/15/2021, 1:49 PM

## 2021-11-15 NOTE — Progress Notes (Addendum)
° °  PATIENT ID: Cheyenne Schwartz   1 Day Post-Op Procedure(s) (LRB): IRRIGATION AND DEBRIDEMENT KNEE AND HARDWARE REMOVAL (Left)  Subjective: Doing well this am. Up and walking on left knee, pain controlled.   Objective:  Vitals:   11/15/21 0605 11/15/21 1009  BP: 138/82 125/64  Pulse: 81 85  Resp: 16 18  Temp: 98.7 F (37.1 C)   SpO2: 100% 100%     L knee dressing c/d/I Wiggles toes, distally NVI  Labs:  Recent Labs    11/15/21 0424  HGB 10.7*   Recent Labs    11/15/21 0424  WBC 9.3  RBC 3.95  HCT 33.2*  PLT 420*  No results for input(s): NA, K, CL, CO2, BUN, CREATININE, GLUCOSE, CALCIUM in the last 72 hours.  Assessment and Plan: 1 day s/p I&D left knee likely without joint involvement Infectious disease to consult today PICC line in place With need HHRN for IV abx mgmt WBAT Fu with dr. Luiz Blare thurs Pharm consult rec Ancef 2mg  q 8 hrs, orders in chart D/c pending ID consult  VTE proph: scds

## 2021-11-15 NOTE — Progress Notes (Signed)
PHARMACY CONSULT NOTE FOR:  OUTPATIENT  PARENTERAL ANTIBIOTIC THERAPY (OPAT)  Indication: Osteomyelitis of Patella Regimen: Cefazolin 2g IV Q8h End date: 12/26/2021  Outpatient cx before admission grew MSSA.   IV antibiotic discharge orders are pended. To discharging provider:  please sign these orders via discharge navigator,  Select New Orders & click on the button choice - Manage This Unsigned Work.    Thank you for allowing pharmacy to be a part of this patient's care.  Enzo Bi, PharmD, BCPS, BCIDP Clinical Pharmacist 11/15/2021 9:02 AM

## 2021-11-19 LAB — AEROBIC/ANAEROBIC CULTURE W GRAM STAIN (SURGICAL/DEEP WOUND)

## 2021-12-01 NOTE — Discharge Summary (Signed)
Patient ID: Cheyenne Schwartz MRN: [id number] DOB/AGE: 1996/03/01 26 y.o.  Admit date: 11/14/2021 Discharge date: 11/15/2021  Admission Diagnoses:  Principal Problem:   Infection of left knee ([place]) Active Problems:   Pre-op testing   MSSA (methicillin susceptible Staphylococcus aureus) infection   Osteomyelitis ([place])   Psoriasis   Hardware complicating wound infection ([place])   Discharge Diagnoses:  Same  Past Medical History:  Diagnosis Date   Anemia    iron   Anxiety    Asthma    Eczema    GERD (gastroesophageal reflux disease)    Palpitations     Surgeries: Procedure(s): IRRIGATION AND DEBRIDEMENT KNEE AND HARDWARE REMOVAL on 11/14/2021   Consultants:   Discharged Condition: Improved  Hospital Course: Katelinn Deakins is an 26 y.o. female who was admitted 11/14/2021 for operative treatment ofInfection of left knee ([place]). Patient has severe unremitting pain that affects sleep, daily activities, and work/hobbies. After pre-op clearance the patient was taken to the operating room on 11/14/2021 and underwent  Procedure(s): IRRIGATION AND DEBRIDEMENT KNEE AND HARDWARE REMOVAL.    Patient was given perioperative antibiotics:  Anti-infectives (From admission, onward)    Start     Dose/Rate Route Frequency Ordered Stop   11/15/21 1400  ceFAZolin (ANCEF) IVPB 2g/100 mL premix  Status:  Discontinued        2 g 200 mL/hr over 30 Minutes Intravenous Every 8 hours 11/15/21 0856 11/15/21 2057   11/15/21 0000  ceFAZolin (ANCEF) IVPB        2 g Intravenous Every 8 hours 11/15/21 1147 12/26/21 2359   11/14/21 1900  ceFAZolin (ANCEF) IVPB 2g/100 mL premix        2 g 200 mL/hr over 30 Minutes Intravenous Every 6 hours 11/14/21 1501 11/15/21 0646   11/14/21 1305  vancomycin (VANCOCIN) powder  Status:  Discontinued          As needed 11/14/21 1305 11/14/21 1455   11/14/21 0945  ceFAZolin (ANCEF) IVPB 2g/100 mL premix        2 g 200 mL/hr over 30 Minutes Intravenous On call to O.R.  11/14/21 0933 11/14/21 1247        Patient was given sequential compression devices, early ambulation, and chemoprophylaxis to prevent DVT.  Patient benefited maximally from hospital stay and there were no complications.    Recent vital signs: No data found.   Recent laboratory studies: No results for input(s): WBC, HGB, HCT, PLT, NA, K, CL, CO2, BUN, CREATININE, GLUCOSE, INR, CALCIUM in the last 72 hours.  Invalid input(s): PT, 2   Discharge Medications:   Allergies as of 11/15/2021       Reactions   Chocolate Flavor Hives, Itching   Cocoa Hives   Latex Hives, Itching   Other Hives, Itching   runny nose and Itchy throat   Fish Allergy Itching   Peanut Oil Itching   Itchy Throat   Bee Pollen Other (See Comments)   Sneezing; runny nose   Pollen Extract    Shellfish Allergy Itching   Itchy throat   Banana Itching   Itchy Throat        Medication List     STOP taking these medications    acetaminophen 500 MG tablet Commonly known as: TYLENOL   cephALEXin 500 MG capsule Commonly known as: KEFLEX   naproxen 500 MG tablet Commonly known as: NAPROSYN       TAKE these medications    ceFAZolin  IVPB Commonly known as: ANCEF Inject 2 g into  the vein every 8 (eight) hours. Indication: Osteomyelitis of Patella First Dose: Yes Last Day of Therapy: 12/26/2021 Labs - Once weekly:  CBC/D and BMP, Labs - Every other week:  ESR and CRP Method of administration: IV Push Method of administration may be changed at the discretion of home infusion pharmacist based upon assessment of the patient and/or caregiver's ability to self-administer the medication ordered.   cetirizine 10 MG tablet Commonly known as: ZYRTEC Take 10 mg by mouth daily as needed for allergies.   Dupixent 300 MG/2ML prefilled syringe Generic drug: dupilumab Inject 300 mg into the skin every 14 (fourteen) days.   EPINEPHrine 0.3 mg/0.3 mL Soaj injection Commonly known as: EPI-PEN SMARTSIG:1  Injection IM PRN   ferrous sulfate 325 (65 FE) MG tablet Take 325 mg by mouth daily with breakfast.   HYDROcodone-acetaminophen 5-325 MG tablet Commonly known as: Norco Take 1-2 tablets by mouth every 6 (six) hours as needed for moderate pain. Notes to patient: 61m oxycodone given at:_____ 10064mtylenol given at 1200pm Hydrocodone-acetaminophen available at:______   hydrOXYzine 25 MG tablet Commonly known as: ATARAX Take 1 tablet (25 mg total) by mouth every 8 (eight) hours as needed for anxiety or itching.   pantoprazole 20 MG tablet Commonly known as: PROTONIX Take 1 tablet (20 mg total) by mouth daily.   ProAir RespiClick [id number] Base) MCG/ACT Aepb Generic drug: Albuterol Sulfate Inhale 2 puffs into the lungs every 6 (six) hours as needed (SOB, wheezing).   sertraline 100 MG tablet Commonly known as: ZOLOFT Take 1 tablet (100 mg total) by mouth at bedtime. For mood   triamcinolone ointment 0.1 % Commonly known as: KENALOG Apply 1 application topically 2 (two) times daily as needed (eczema).   VITAMIN C GUMMIE PO Take 2 tablets by mouth daily.               Discharge Care Instructions  (From admission, onward)           Start     Ordered   11/15/21 0000  Change dressing on IV access line weekly and PRN  (Home infusion instructions - [facility] )        11/15/21 1147            Diagnostic Studies: US[place]KG SITE RITE  Result Date: 11/14/2021 If Site Rite image not attached, placement could not be confirmed due to current cardiac rhythm.   Disposition: Discharge disposition: 01-Home or Self Care       Discharge Instructions     [facility] pharmacist to adjust dose for Vancomycin, Aminoglycosides and other anti-infective therapies as requested by physician.   Complete by: As directed    [facility] to provide Cath Flo 60m60m Complete by: As directed    Administer for PICC line occlusion and as ordered by  physician for other access device issues.   Anaphylaxis Kit: Provided to treat any anaphylactic reaction to the medication being provided to the patient if First Dose or when requested by physician   Complete by: As directed    Epinephrine 1mg11m vial / amp: Administer 0.3mg 64m3ml) 55mcutaneously once for moderate to severe anaphylaxis, nurse to call physician and pharmacy when reaction occurs and call 911 if needed for immediate care   Diphenhydramine 50mg/m19m vial: Administer 25-50mg IV20mPRN for first dose reaction, rash, itching, mild reaction, nurse to call physician and pharmacy when reaction occurs   Sodium Chloride 0.9% NS 500ml IV:860minister if  needed for hypovolemic blood pressure drop or as ordered by physician after call to physician with anaphylactic reaction   Call MD / Call 911   Complete by: As directed    If you experience chest pain or shortness of breath, CALL 911 and be transported to the hospital emergency room.  If you develope a fever above 101 F, pus (white drainage) or increased drainage or redness at the wound, or calf pain, call your surgeon's office.   Change dressing on IV access line weekly and PRN   Complete by: As directed    Constipation Prevention   Complete by: As directed    Drink plenty of fluids.  Prune juice may be helpful.  You may use a stool softener, such as Colace (over the counter) 100 mg twice a day.  Use MiraLax (over the counter) for constipation as needed.   Diet - low sodium heart healthy   Complete by: As directed    Flush IV access with Sodium Chloride 0.9% and Heparin 10 units/ml or 100 units/ml   Complete by: As directed    Home infusion instructions - [facility]   Complete by: As directed    Instructions: Flush IV access with Sodium Chloride 0.9% and Heparin 10units/ml or 100units/ml   Change dressing on IV access line: Weekly and PRN   Instructions Cath Flo 59m: Administer for PICC Line occlusion and as ordered by  physician for other access device   [facility] pharmacist to adjust dose for: Vancomycin, Aminoglycosides and other anti-infective therapies as requested by physician   Increase activity slowly as tolerated   Complete by: As directed    Method of administration may be changed at the discretion of home infusion pharmacist based upon assessment of the patient and/or caregivers ability to self-administer the medication ordered   Complete by: As directed    Post-operative opioid taper instructions:   Complete by: As directed    POST-OPERATIVE OPIOID TAPER INSTRUCTIONS: It is important to wean off of your opioid medication as soon as possible. If you do not need pain medication after your surgery it is ok to stop day one. Opioids include: Codeine, Hydrocodone(Norco, Vicodin), Oxycodone(Percocet, oxycontin) and hydromorphone amongst others.  Long term and even short term use of opiods can cause: Increased pain response Dependence Constipation Depression Respiratory depression And more.  Withdrawal symptoms can include Flu like symptoms Nausea, vomiting And more Techniques to manage these symptoms Hydrate well Eat regular healthy meals Stay active Use relaxation techniques(deep breathing, meditating, yoga) Do Not substitute Alcohol to help with tapering If you have been on opioids for less than two weeks and do not have pain than it is ok to stop all together.  Plan to wean off of opioids This plan should start within one week post op of your joint replacement. Maintain the same interval or time between taking each dose and first decrease the dose.  Cut the total daily intake of opioids by one tablet each day Next start to increase the time between doses. The last dose that should be eliminated is the evening dose.           Follow-up Information     GDorna Leitz MD Follow up on 11/17/2021.   Specialty: Orthopedic Surgery Why: For wound re-check Contact  information: [phone]                  Signed: DGrier Mitts1/03/2022, 8:47 AM

## 2021-12-08 ENCOUNTER — Telehealth: Payer: Self-pay

## 2021-12-08 NOTE — Telephone Encounter (Signed)
Clarene Critchley with [facility] called wanting to know if patient's antibiotic regimen can be changed. She says the patient is having trouble fitting in all three doses and is wondering if there is an option for 1-2 doses a day.   Beryle Flock, RN

## 2021-12-14 ENCOUNTER — Telehealth: Payer: Self-pay

## 2021-12-14 ENCOUNTER — Ambulatory Visit (INDEPENDENT_AMBULATORY_CARE_PROVIDER_SITE_OTHER): Payer: BC Managed Care – PPO | Admitting: Internal Medicine

## 2021-12-14 ENCOUNTER — Encounter: Payer: Self-pay | Admitting: Internal Medicine

## 2021-12-14 ENCOUNTER — Other Ambulatory Visit: Payer: Self-pay

## 2021-12-14 VITALS — BP 120/76 | HR 64 | Temp 98.1°F | Ht 65.0 in | Wt 184.0 lb

## 2021-12-14 DIAGNOSIS — M009 Pyogenic arthritis, unspecified: Secondary | ICD-10-CM | POA: Diagnosis not present

## 2021-12-14 NOTE — Telephone Encounter (Signed)
Thank you Megan.

## 2021-12-14 NOTE — Telephone Encounter (Signed)
Relayed verbal orders to [place] at [facility] that okay to pull PICC after last dose per Dr. Thedore Mins. Orders repeated and verified.   Sandie Ano, RN

## 2021-12-14 NOTE — Progress Notes (Signed)
Patient: Cheyenne Schwartz  DOB: 01-Jul-1996 MRN: [id number] PCP: System, Provider Not In  Referring Provider: Hospital follow-up  Chief Complaint  Patient presents with   Follow-up     Patient Active Problem List   Diagnosis Date Noted   Pre-op testing    MSSA (methicillin susceptible Staphylococcus aureus) infection    Osteomyelitis ([place])    Psoriasis    Hardware complicating wound infection ([place])    Infection of left knee ([place]) 11/14/2021   Severe recurrent major depression without psychotic features ([place]) 01/19/2019     HPI:  Cheyenne Schwartz is a 26 y.o. F withHistory of eczema, patella subluxation with multiple episodes and failure of conservative care SP medial femoral ligaments in construction.  She underwent MPFL in June,2022. She was progressing  well until she started developing superficial infection around the medial patellofemoral wound.  The area was aspirated and found to have MSSA treated with Keflex x2.  The third time she devolped wound it started spontaneosly drainng.  Taken to the OR on 11/14/21 where she had debridement and hardware removal(medial patellar wound swivelock/sutures and femoral side screw). There was potential involvement of bone per OR note.  Cultures grew MSSA patient placed on 6 weeks of IV cefazolin with end date 12/26/2021. Today, pt reports her knee feel well. She reports taking only 2 doses of cefazolin about 2-3/week, otherwise she is adherent to three times/day antibiotics. She has eczema and denies worsening of rashes(12/05/21) abs eos 1500. ESR, 14, CRP<1. Review of Systems  All other systems reviewed and are negative.  Past Medical History:  Diagnosis Date   Anemia    iron   Anxiety    Asthma    Eczema    GERD (gastroesophageal reflux disease)    Palpitations     Outpatient Medications Prior to Visit  Medication Sig Dispense Refill   Ascorbic Acid (VITAMIN C GUMMIE PO) Take 2 tablets by mouth daily.     ceFAZolin (ANCEF) IVPB  Inject 2 g into the vein every 8 (eight) hours. Indication: Osteomyelitis of Patella First Dose: Yes Last Day of Therapy: 12/26/2021 Labs - Once weekly:  CBC/D and BMP, Labs - Every other week:  ESR and CRP Method of administration: IV Push Method of administration may be changed at the discretion of home infusion pharmacist based upon assessment of the patient and/or caregiver's ability to self-administer the medication ordered. 123 Units 0   cetirizine (ZYRTEC) 10 MG tablet Take 10 mg by mouth daily as needed for allergies.     DUPIXENT 300 MG/2ML prefilled syringe Inject 300 mg into the skin every 14 (fourteen) days.     EPINEPHrine 0.3 mg/0.3 mL IJ SOAJ injection SMARTSIG:1 Injection IM PRN     ferrous sulfate 325 (65 FE) MG tablet Take 325 mg by mouth daily with breakfast.     HYDROcodone-acetaminophen (NORCO) 5-325 MG tablet Take 1-2 tablets by mouth every 6 (six) hours as needed for moderate pain. 30 tablet 0   hydrOXYzine (ATARAX/VISTARIL) 25 MG tablet Take 1 tablet (25 mg total) by mouth every 8 (eight) hours as needed for anxiety or itching. 60 tablet 1   pantoprazole (PROTONIX) 20 MG tablet Take 1 tablet (20 mg total) by mouth daily. (Patient not taking: Reported on 11/04/2021) 14 tablet 0   PROAIR RESPICLICK 497 (90 Base) MCG/ACT AEPB Inhale 2 puffs into the lungs every 6 (six) hours as needed (SOB, wheezing).      sertraline (ZOLOFT) 100 MG tablet Take 1 tablet (  100 mg total) by mouth at bedtime. For mood 30 tablet 0   triamcinolone ointment (KENALOG) 0.1 % Apply 1 application topically 2 (two) times daily as needed (eczema).     No facility-administered medications prior to visit.     Allergies  Allergen Reactions   Chocolate Flavor Hives and Itching   Cocoa Hives   Latex Hives and Itching   Other Hives and Itching    runny nose and Itchy throat   Fish Allergy Itching   Peanut Oil Itching    Itchy Throat    Bee Pollen Other (See Comments)    Sneezing; runny nose   Pollen  Extract    Shellfish Allergy Itching    Itchy throat   Banana Itching    Itchy Throat     Social History   Tobacco Use   Smoking status: Never   Smokeless tobacco: Never  Vaping Use   Vaping Use: Never used  Substance Use Topics   Alcohol use: Yes    Alcohol/week: 4.0 standard drinks    Types: 4 Standard drinks or equivalent per week   Drug use: Never    No family history on file.  Objective:  There were no vitals filed for this visit. There is no height or weight on file to calculate BMI.  Physical Exam Constitutional:      Appearance: Normal appearance.  HENT:     Head: Normocephalic and atraumatic.     Right Ear: Tympanic membrane normal.     Left Ear: Tympanic membrane normal.     Nose: Nose normal.     Mouth/Throat:     Mouth: Mucous membranes are moist.  Eyes:     Extraocular Movements: Extraocular movements intact.     Conjunctiva/sclera: Conjunctivae normal.     Pupils: Pupils are equal, round, and reactive to light.  Cardiovascular:     Rate and Rhythm: Normal rate and regular rhythm.     Heart sounds: No murmur heard.   No friction rub. No gallop.  Pulmonary:     Effort: Pulmonary effort is normal.     Breath sounds: Normal breath sounds.  Abdominal:     General: Abdomen is flat.     Palpations: Abdomen is soft.  Musculoskeletal:        General: Normal range of motion.  Skin:    General: Skin is warm and dry.     Comments: Left knee surgical wound C/D/I  Neurological:     General: No focal deficit present.     Mental Status: She is alert and oriented to person, place, and time.  Psychiatric:        Mood and Affect: Mood normal.    Lab Results: Lab Results  Component Value Date   WBC 9.3 11/15/2021   HGB 10.7 (L) 11/15/2021   HCT 33.2 (L) 11/15/2021   MCV 84.1 11/15/2021   PLT 420 (H) 11/15/2021    Lab Results  Component Value Date   CREATININE 0.66 11/15/2021   BUN 9 11/15/2021   NA 138 11/15/2021   K 3.5 11/15/2021   CL 106  11/15/2021   CO2 26 11/15/2021    Lab Results  Component Value Date   ALT 12 11/15/2021   AST 16 11/15/2021   ALKPHOS 54 11/15/2021   BILITOT 0.3 11/15/2021     Assessment & Plan:  #Infected left medial femoral ligament infection SP hardware removal #OR Cx+ MSSA -Concern for bone involvement per OR note, pt placed on cefazolin x  6 week(EOT 12/26/20) -Will reach out to Dr. Berenice Primas to see if there is a retained graft, if so plan on 3 additional months of PO keflex for suppression -Counseled pt extensively about being adherent to 3x/day cefazolin and the risk of treatment failure and resistance -Return to clinic in 3 weeks   Laurice Record, [place] [facility]   12/14/21  9:40 AM

## 2022-01-06 ENCOUNTER — Ambulatory Visit (INDEPENDENT_AMBULATORY_CARE_PROVIDER_SITE_OTHER): Payer: BC Managed Care – PPO | Admitting: Internal Medicine

## 2022-01-06 ENCOUNTER — Encounter: Payer: Self-pay | Admitting: Internal Medicine

## 2022-01-06 ENCOUNTER — Other Ambulatory Visit: Payer: Self-pay

## 2022-01-06 VITALS — BP 124/83 | HR 82 | Temp 98.0°F | Wt 190.0 lb

## 2022-01-06 DIAGNOSIS — M009 Pyogenic arthritis, unspecified: Secondary | ICD-10-CM

## 2022-01-06 MED ORDER — CEFADROXIL 500 MG PO CAPS: 500.0000 mg | ORAL_CAPSULE | Freq: Two times a day (BID) | ORAL | 3 refills | Status: DC

## 2022-01-06 NOTE — Progress Notes (Signed)
Patient Active Problem List   Diagnosis Date Noted   Pre-op testing    MSSA (methicillin susceptible Staphylococcus aureus) infection    Osteomyelitis ([place])    Psoriasis    Hardware complicating wound infection ([place])    Infection of left knee ([place]) 11/14/2021   Severe recurrent major depression without psychotic features ([place]) 01/19/2019    Patient's Medications  New Prescriptions   No medications on file  Previous Medications   ASCORBIC ACID (VITAMIN C GUMMIE PO)    Take 2 tablets by mouth daily.   CETIRIZINE (ZYRTEC) 10 MG TABLET    Take 10 mg by mouth daily as needed for allergies.   DUPIXENT 300 MG/2ML PREFILLED SYRINGE    Inject 300 mg into the skin every 14 (fourteen) days.   EPINEPHRINE 0.3 MG/0.3 ML IJ SOAJ INJECTION    SMARTSIG:1 Injection IM PRN   FERROUS SULFATE 325 (65 FE) MG TABLET    Take 325 mg by mouth daily with breakfast.   HYDROCODONE-ACETAMINOPHEN (NORCO) 5-325 MG TABLET    Take 1-2 tablets by mouth every 6 (six) hours as needed for moderate pain.   HYDROXYZINE (ATARAX/VISTARIL) 25 MG TABLET    Take 1 tablet (25 mg total) by mouth every 8 (eight) hours as needed for anxiety or itching.   OXYCODONE-ACETAMINOPHEN (PERCOCET/ROXICET) 5-325 MG TABLET    Take 1-2 tablets by mouth every 4 (four) hours as needed.   PANTOPRAZOLE (PROTONIX) 20 MG TABLET    Take 1 tablet (20 mg total) by mouth daily.   PROAIR RESPICLICK 122 (90 BASE) MCG/ACT AEPB    Inhale 2 puffs into the lungs every 6 (six) hours as needed (SOB, wheezing).   SERTRALINE (ZOLOFT) 100 MG TABLET    Take 1 tablet (100 mg total) by mouth at bedtime. For mood   TRIAMCINOLONE OINTMENT (KENALOG) 0.1 %    Apply 1 application topically 2 (two) times daily as needed (eczema).  Modified Medications   No medications on file  Discontinued Medications   No medications on file    Subjective: Cheyenne Schwartz is a 26 y.o. F withHistory of eczema, patella subluxation with multiple episodes and failure of  conservative care SP medial femoral ligaments in construction.  She underwent MPFL in June,2022. She was progressing  well until she started developing superficial infection around the medial patellofemoral wound.  The area was aspirated and found to have MSSA treated with Keflex x2.  The third time she devolped wound it started spontaneosly drainng.  Taken to the OR on 11/14/21 where she had debridement and hardware removal(medial patellar wound swivelock/sutures and femoral side screw). There was potential involvement of bone per OR note.  Cultures grew MSSA patient placed on 6 weeks of IV cefazolin with end date 12/26/2021. 12/14/21: pt reports her knee feel well. She reports taking only 2 doses of cefazolin about 2-3/week, otherwise she is adherent to three times/day antibiotics. She has eczema and denies worsening of rashes(12/05/21) abs eos 1500. ESR, 14, CRP<1. Today: Pt reports she is feeling better. Knee feels better, w/o much limitations. Antibiotics stopped on 12/26/21  Review of Systems: Review of Systems  All other systems reviewed and are negative.  Past Medical History:  Diagnosis Date   Anemia    iron   Anxiety    Asthma    Eczema    GERD (gastroesophageal reflux disease)    Palpitations     Social History   Tobacco Use   Smoking  status: Never   Smokeless tobacco: Never  Vaping Use   Vaping Use: Never used  Substance Use Topics   Alcohol use: Not Currently    Alcohol/week: 4.0 standard drinks    Types: 4 Standard drinks or equivalent per week   Drug use: Never    No family history on file.  Allergies  Allergen Reactions   Chocolate Flavor Hives and Itching   Cocoa Hives   Latex Hives and Itching   Other Hives and Itching    runny nose and Itchy throat   Fish Allergy Itching   Peanut Oil Itching    Itchy Throat Allergic to most nuts per patient    Bee Pollen Other (See Comments)    Sneezing; runny nose   Pollen Extract    Shellfish Allergy Itching    Itchy  throat   Banana Itching    Itchy Throat     Health Maintenance  Topic Date Due   COVID-19 Vaccine (1) Never done   PAP-Cervical Cytology Screening  Never done   PAP SMEAR-Modifier  Never done   INFLUENZA VACCINE  06/27/2021   TETANUS/TDAP  06/27/2026   HPV VACCINES  Completed   Hepatitis C Screening  Completed   HIV Screening  Completed    Objective:  Vitals:   01/06/22 1112  BP: 124/83  Pulse: 82  Temp: 98 F (36.7 C)  TempSrc: Temporal  Weight: 190 lb (86.2 kg)   Body mass index is 31.62 kg/m.  Physical Exam Constitutional:      Appearance: Normal appearance.  HENT:     Head: Normocephalic and atraumatic.     Right Ear: Tympanic membrane normal.     Left Ear: Tympanic membrane normal.     Nose: Nose normal.     Mouth/Throat:     Mouth: Mucous membranes are moist.  Eyes:     Extraocular Movements: Extraocular movements intact.     Conjunctiva/sclera: Conjunctivae normal.     Pupils: Pupils are equal, round, and reactive to light.  Cardiovascular:     Rate and Rhythm: Normal rate and regular rhythm.     Heart sounds: No murmur heard.   No friction rub. No gallop.  Pulmonary:     Effort: Pulmonary effort is normal.     Breath sounds: Normal breath sounds.  Abdominal:     General: Abdomen is flat.     Palpations: Abdomen is soft.  Musculoskeletal:        General: Normal range of motion.  Skin:    General: Skin is warm and dry.     Comments: Scaly rash improved overall  Neurological:     General: No focal deficit present.     Mental Status: She is alert and oriented to person, place, and time.  Psychiatric:        Mood and Affect: Mood normal.    Lab Results Lab Results  Component Value Date   WBC 9.3 11/15/2021   HGB 10.7 (L) 11/15/2021   HCT 33.2 (L) 11/15/2021   MCV 84.1 11/15/2021   PLT 420 (H) 11/15/2021    Lab Results  Component Value Date   CREATININE 0.66 11/15/2021   BUN 9 11/15/2021   NA 138 11/15/2021   K 3.5 11/15/2021   CL  106 11/15/2021   CO2 26 11/15/2021    Lab Results  Component Value Date   ALT 12 11/15/2021   AST 16 11/15/2021   ALKPHOS 54 11/15/2021   BILITOT 0.3 11/15/2021  Lab Results  Component Value Date   CHOL 260 (H) 01/19/2019   HDL 77 01/19/2019   LDLCALC 171 (H) 01/19/2019   TRIG 61 01/19/2019   CHOLHDL 3.4 01/19/2019   No results found for: LABRPR, RPRTITER No results found for: HIV1RNAQUANT, HIV1RNAVL, CD4TABS   #Infected left medial femoral ligament infection SP hardware removal #OR Cx+ MSSA --Completed cefazolin x 6 week(EOT 12/26/20) -Retained graft in place-plan on cefadroxil 500 bid for suppression  Although a very small piece of graft material is retained. Given MSSA and pt is on Dupixant for eczema(Rx by allergist in [place] since October 2022) it is monoclonal ab may contribute ability to clear infection. -Labs today cbc, cmp, esr and crp -Follow-up in  3 months  Laurice Record, [place] [facility] 01/06/2022, 11:35 AM

## 2022-01-07 LAB — CBC WITH DIFFERENTIAL/PLATELET
Absolute Monocytes: 622 cells/uL (ref 200–950)
Basophils Absolute: 59 cells/uL (ref 0–200)
Basophils Relative: 0.8 %
Eosinophils Absolute: 1265 cells/uL — ABNORMAL HIGH (ref 15–500)
Eosinophils Relative: 17.1 %
HCT: 35.7 % (ref 35.0–45.0)
Hemoglobin: 11.4 g/dL — ABNORMAL LOW (ref 11.7–15.5)
Lymphs Abs: 2390 cells/uL (ref 850–3900)
MCH: 25.7 pg — ABNORMAL LOW (ref 27.0–33.0)
MCHC: 31.9 g/dL — ABNORMAL LOW (ref 32.0–36.0)
MCV: 80.4 fL (ref 80.0–100.0)
MPV: 10.2 fL (ref 7.5–12.5)
Monocytes Relative: 8.4 %
Neutro Abs: 3064 cells/uL (ref 1500–7800)
Neutrophils Relative %: 41.4 %
Platelets: 425 10*3/uL — ABNORMAL HIGH (ref 140–400)
RBC: 4.44 10*6/uL (ref 3.80–5.10)
RDW: 12.2 % (ref 11.0–15.0)
Total Lymphocyte: 32.3 %
WBC: 7.4 10*3/uL (ref 3.8–10.8)

## 2022-01-07 LAB — COMPLETE METABOLIC PANEL WITH GFR
AG Ratio: 1.5 (calc) (ref 1.0–2.5)
ALT: 10 U/L (ref 6–29)
AST: 13 U/L (ref 10–30)
Albumin: 4.5 g/dL (ref 3.6–5.1)
Alkaline phosphatase (APISO): 74 U/L (ref 31–125)
BUN: 9 mg/dL (ref 7–25)
CO2: 27 mmol/L (ref 20–32)
Calcium: 9.5 mg/dL (ref 8.6–10.2)
Chloride: 102 mmol/L (ref 98–110)
Creat: 0.7 mg/dL (ref 0.50–0.96)
Globulin: 3.1 g/dL (calc) (ref 1.9–3.7)
Glucose, Bld: 61 mg/dL — ABNORMAL LOW (ref 65–99)
Potassium: 4.2 mmol/L (ref 3.5–5.3)
Sodium: 138 mmol/L (ref 135–146)
Total Bilirubin: 0.4 mg/dL (ref 0.2–1.2)
Total Protein: 7.6 g/dL (ref 6.1–8.1)
eGFR: 124 mL/min/{1.73_m2} (ref >=60)

## 2022-01-07 LAB — SEDIMENTATION RATE: Sed Rate: 6 mm/h (ref 0–20)

## 2022-01-07 LAB — C-REACTIVE PROTEIN: CRP: 1.3 mg/L (ref <8.0)

## 2022-04-07 ENCOUNTER — Encounter: Payer: Self-pay | Admitting: Internal Medicine

## 2022-04-07 ENCOUNTER — Other Ambulatory Visit: Payer: Self-pay

## 2022-04-07 ENCOUNTER — Ambulatory Visit: Payer: BC Managed Care – PPO | Admitting: Internal Medicine

## 2022-04-07 VITALS — BP 124/82 | HR 82 | Temp 97.9°F | Wt 205.0 lb

## 2022-04-07 DIAGNOSIS — B9561 Methicillin susceptible Staphylococcus aureus infection as the cause of diseases classified elsewhere: Secondary | ICD-10-CM

## 2022-04-07 DIAGNOSIS — L089 Local infection of the skin and subcutaneous tissue, unspecified: Secondary | ICD-10-CM

## 2022-04-07 DIAGNOSIS — T84621A Infection and inflammatory reaction due to internal fixation device of left femur, initial encounter: Secondary | ICD-10-CM | POA: Diagnosis not present

## 2022-04-07 DIAGNOSIS — S80252S Superficial foreign body, left knee, sequela: Secondary | ICD-10-CM

## 2022-04-07 MED ORDER — CEFADROXIL 500 MG PO CAPS: 500.0000 mg | ORAL_CAPSULE | Freq: Two times a day (BID) | ORAL | 5 refills | Status: DC

## 2022-04-07 NOTE — Progress Notes (Signed)
? ?   ? ? ? ? ?Patient Active Problem List  ? Diagnosis Date Noted  ? Pre-op testing   ? MSSA (methicillin susceptible Staphylococcus aureus) infection   ? Osteomyelitis ([place])   ? Psoriasis   ? Hardware complicating wound infection ([place])   ? Infection of left knee ([place]) 11/14/2021  ? Severe recurrent major depression without psychotic features ([place]) 01/19/2019  ? ? ?Patient's Medications  ?New Prescriptions  ? No medications on file  ?Previous Medications  ? ASCORBIC ACID (VITAMIN C GUMMIE PO)    Take 2 tablets by mouth daily.  ? CEFADROXIL (DURICEF) 500 MG CAPSULE    Take 1 capsule (500 mg total) by mouth 2 (two) times daily.  ? CETIRIZINE (ZYRTEC) 10 MG TABLET    Take 10 mg by mouth daily as needed for allergies.  ? DUPIXENT 300 MG/2ML PREFILLED SYRINGE    Inject 300 mg into the skin every 14 (fourteen) days.  ? EPINEPHRINE 0.3 MG/0.3 ML IJ SOAJ INJECTION    SMARTSIG:1 Injection IM PRN  ? FERROUS SULFATE 325 (65 FE) MG TABLET    Take 325 mg by mouth daily with breakfast.  ? HYDROCODONE-ACETAMINOPHEN (NORCO) 5-325 MG TABLET    Take 1-2 tablets by mouth every 6 (six) hours as needed for moderate pain.  ? HYDROXYZINE (ATARAX/VISTARIL) 25 MG TABLET    Take 1 tablet (25 mg total) by mouth every 8 (eight) hours as needed for anxiety or itching.  ? OXYCODONE-ACETAMINOPHEN (PERCOCET/ROXICET) 5-325 MG TABLET    Take 1-2 tablets by mouth every 4 (four) hours as needed.  ? PANTOPRAZOLE (PROTONIX) 20 MG TABLET    Take 1 tablet (20 mg total) by mouth daily.  ? PROAIR RESPICLICK 378 (90 BASE) MCG/ACT AEPB    Inhale 2 puffs into the lungs every 6 (six) hours as needed (SOB, wheezing).  ? SERTRALINE (ZOLOFT) 100 MG TABLET    Take 1 tablet (100 mg total) by mouth at bedtime. For mood  ? TRIAMCINOLONE OINTMENT (KENALOG) 0.1 %    Apply 1 application topically 2 (two) times daily as needed (eczema).  ?Modified Medications  ? No medications on file  ?Discontinued Medications  ? No medications on file  ? ? ?Subjective: ?Kani Tarazon  is a 26 y.o. F withHistory of eczema, patella subluxation with multiple episodes and failure of conservative care SP medial femoral ligaments in construction.  She underwent MPFL in June,2022. She was progressing  well until she started developing superficial infection around the medial patellofemoral wound.  The area was aspirated and found to have MSSA treated with Keflex x2.  The third time she devolped wound it started spontaneosly drainng.  Taken to the OR on 11/14/21 where she had debridement and hardware removal(medial patellar wound swivelock/sutures and femoral side screw). There was potential involvement of bone per OR note.  Cultures grew MSSA patient placed on 6 weeks of IV cefazolin with end date 12/26/2021. ?12/14/21: pt reports her knee feel well. She reports taking only 2 doses of cefazolin about 2-3/week, otherwise she is adherent to three times/day antibiotics. She has eczema and denies worsening of rashes(12/05/21) abs eos 1500. ESR, 14, CRP<1. ?01/06/22: Pt reports she is feeling better. Knee feels better, w/o much limitations. Antibiotics stopped on 12/26/21  ?04/07/22: She never picked up the prescription of cefadroxil. Reprts knee"feels fine".  ? ?Review of Systems: ?Review of Systems  ?All other systems reviewed and are negative. ? ?Past Medical History:  ?Diagnosis Date  ? Anemia   ?  iron  ? Anxiety   ? Asthma   ? Eczema   ? GERD (gastroesophageal reflux disease)   ? Palpitations   ? ? ?Social History  ? ?Tobacco Use  ? Smoking status: Never  ? Smokeless tobacco: Never  ?Vaping Use  ? Vaping Use: Never used  ?Substance Use Topics  ? Alcohol use: Not Currently  ?  Alcohol/week: 4.0 standard drinks  ?  Types: 4 Standard drinks or equivalent per week  ? Drug use: Never  ? ? ?No family history on file. ? ?Allergies  ?Allergen Reactions  ? Chocolate Flavor Hives and Itching  ? Cocoa Hives  ? Latex Hives and Itching  ? Other Hives and Itching  ?  runny nose and Itchy throat  ? Apple Itching  ? Fish  Allergy Itching  ? Peanut Oil Itching  ?  Itchy Throat ?Allergic to most nuts per patient   ? Bee Pollen Other (See Comments)  ?  Sneezing; runny nose  ? Pollen Extract   ? Shellfish Allergy Itching  ?  Itchy throat  ? Banana Itching  ?  Itchy Throat ?  ? ? ?Health Maintenance  ?Topic Date Due  ? COVID-19 Vaccine (1) Never done  ? PAP-Cervical Cytology Screening  Never done  ? PAP SMEAR-Modifier  Never done  ? INFLUENZA VACCINE  06/27/2022  ? TETANUS/TDAP  06/27/2026  ? HPV VACCINES  Completed  ? Hepatitis C Screening  Completed  ? HIV Screening  Completed  ? ? ?Objective: ? ?Vitals:  ? 04/07/22 1145  ?Weight: 205 lb (93 kg)  ? ?Body mass index is 34.11 kg/m?. ? ?Physical Exam ?Constitutional:   ?   Appearance: Normal appearance.  ?HENT:  ?   Head: Normocephalic and atraumatic.  ?   Right Ear: Tympanic membrane normal.  ?   Left Ear: Tympanic membrane normal.  ?   Nose: Nose normal.  ?   Mouth/Throat:  ?   Mouth: Mucous membranes are moist.  ?Eyes:  ?   Extraocular Movements: Extraocular movements intact.  ?   Conjunctiva/sclera: Conjunctivae normal.  ?   Pupils: Pupils are equal, round, and reactive to light.  ?Cardiovascular:  ?   Rate and Rhythm: Normal rate and regular rhythm.  ?   Heart sounds: No murmur heard. ?  No friction rub. No gallop.  ?Pulmonary:  ?   Effort: Pulmonary effort is normal.  ?   Breath sounds: Normal breath sounds.  ?Abdominal:  ?   General: Abdomen is flat.  ?   Palpations: Abdomen is soft.  ?Musculoskeletal:     ?   General: Normal range of motion.  ?Skin: ?   General: Skin is warm and dry.  ?Neurological:  ?   General: No focal deficit present.  ?   Mental Status: She is alert and oriented to person, place, and time.  ?Psychiatric:     ?   Mood and Affect: Mood normal.  ? ? ?Lab Results ?Lab Results  ?Component Value Date  ? WBC 7.4 01/06/2022  ? HGB 11.4 (L) 01/06/2022  ? HCT 35.7 01/06/2022  ? MCV 80.4 01/06/2022  ? PLT 425 (H) 01/06/2022  ?  ?Lab Results  ?Component Value Date  ?  CREATININE 0.70 01/06/2022  ? BUN 9 01/06/2022  ? NA 138 01/06/2022  ? K 4.2 01/06/2022  ? CL 102 01/06/2022  ? CO2 27 01/06/2022  ?  ?Lab Results  ?Component Value Date  ? ALT 10 01/06/2022  ? AST  13 01/06/2022  ? ALKPHOS 54 11/15/2021  ? BILITOT 0.4 01/06/2022  ?  ?Lab Results  ?Component Value Date  ? CHOL 260 (H) 01/19/2019  ? HDL 77 01/19/2019  ? [place] 171 (H) 01/19/2019  ? TRIG 61 01/19/2019  ? CHOLHDL 3.4 01/19/2019  ? ?No results found for: LABRPR, RPRTITER ?No results found for: HIV1RNAQUANT, HIV1RNAVL, CD4TABS ?  ?#Infected left medial femoral ligament infection SP hardware removal ?#OR Cx+ MSSA ?#medication on-adherence ?--Completed cefazolin x 6 week(EOT 12/26/20) ?-Retained graft in place. Pt never picked up cefadroxil. I discussed that although she is 3 months out from IV antibiotics would still do suppressive antibiotics as she is on Dupixa. ?-Counseled extensively on medication adherence.  ?Plan ?-Sh is amenable to starting cefadroxil 500 mg PO bid. Will need suppression for at least 6 months ?-Labs today cbc, cmp, esr and crp ?-Follow-up in  2 months ? ? ?Laurice Record, MD ?[facility] ?[facility] ?04/07/2022, 11:46 AM  ?

## 2022-04-08 LAB — CBC WITH DIFFERENTIAL/PLATELET
Absolute Monocytes: 546 cells/uL (ref 200–950)
Basophils Absolute: 42 cells/uL (ref 0–200)
Basophils Relative: 0.7 %
Eosinophils Absolute: 570 cells/uL — ABNORMAL HIGH (ref 15–500)
Eosinophils Relative: 9.5 %
HCT: 35.3 % (ref 35.0–45.0)
Hemoglobin: 10.8 g/dL — ABNORMAL LOW (ref 11.7–15.5)
Lymphs Abs: 2400 cells/uL (ref 850–3900)
MCH: 24.2 pg — ABNORMAL LOW (ref 27.0–33.0)
MCHC: 30.6 g/dL — ABNORMAL LOW (ref 32.0–36.0)
MCV: 79.1 fL — ABNORMAL LOW (ref 80.0–100.0)
MPV: 10.2 fL (ref 7.5–12.5)
Monocytes Relative: 9.1 %
Neutro Abs: 2442 cells/uL (ref 1500–7800)
Neutrophils Relative %: 40.7 %
Platelets: 405 10*3/uL — ABNORMAL HIGH (ref 140–400)
RBC: 4.46 10*6/uL (ref 3.80–5.10)
RDW: 13.7 % (ref 11.0–15.0)
Total Lymphocyte: 40 %
WBC: 6 10*3/uL (ref 3.8–10.8)

## 2022-04-08 LAB — COMPLETE METABOLIC PANEL WITH GFR
AG Ratio: 1.4 (calc) (ref 1.0–2.5)
ALT: 13 U/L (ref 6–29)
AST: 12 U/L (ref 10–30)
Albumin: 3.9 g/dL (ref 3.6–5.1)
Alkaline phosphatase (APISO): 68 U/L (ref 31–125)
BUN: 10 mg/dL (ref 7–25)
CO2: 25 mmol/L (ref 20–32)
Calcium: 8.9 mg/dL (ref 8.6–10.2)
Chloride: 104 mmol/L (ref 98–110)
Creat: 0.62 mg/dL (ref 0.50–0.96)
Globulin: 2.8 g/dL (calc) (ref 1.9–3.7)
Glucose, Bld: 73 mg/dL (ref 65–99)
Potassium: 4.2 mmol/L (ref 3.5–5.3)
Sodium: 137 mmol/L (ref 135–146)
Total Bilirubin: 0.4 mg/dL (ref 0.2–1.2)
Total Protein: 6.7 g/dL (ref 6.1–8.1)
eGFR: 127 mL/min/{1.73_m2} (ref >=60)

## 2022-04-08 LAB — SEDIMENTATION RATE: Sed Rate: 6 mm/h (ref 0–20)

## 2022-04-08 LAB — C-REACTIVE PROTEIN: CRP: 3 mg/L (ref <8.0)

## 2022-06-13 ENCOUNTER — Ambulatory Visit: Payer: BC Managed Care – PPO | Admitting: Internal Medicine

## 2022-06-16 ENCOUNTER — Ambulatory Visit: Payer: BC Managed Care – PPO | Admitting: Internal Medicine

## 2022-06-19 ENCOUNTER — Encounter: Payer: Self-pay | Admitting: Internal Medicine

## 2022-06-19 ENCOUNTER — Ambulatory Visit: Payer: BC Managed Care – PPO | Admitting: Internal Medicine

## 2022-06-19 ENCOUNTER — Other Ambulatory Visit: Payer: Self-pay

## 2022-06-19 VITALS — BP 131/84 | HR 89 | Temp 98.4°F | Wt 210.0 lb

## 2022-06-19 DIAGNOSIS — S80252A Superficial foreign body, left knee, initial encounter: Secondary | ICD-10-CM

## 2022-06-19 DIAGNOSIS — L089 Local infection of the skin and subcutaneous tissue, unspecified: Secondary | ICD-10-CM | POA: Diagnosis not present

## 2022-06-19 MED ORDER — CEFADROXIL 500 MG PO CAPS: 500.0000 mg | ORAL_CAPSULE | Freq: Two times a day (BID) | ORAL | 5 refills | Status: DC

## 2022-06-19 NOTE — Progress Notes (Signed)
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   CEFADROXIL (DURICEF) 500 MG CAPSULE    Take 1 capsule (500 mg total) by mouth 2 (two) times daily.   CETIRIZINE (ZYRTEC) 10 MG TABLET    Take 10 mg by mouth daily as needed for allergies.   DUPIXENT 300 MG/2ML PREFILLED SYRINGE    Inject 300 mg into the skin every 14 (fourteen) days.   EPINEPHRINE 0.3 MG/0.3 ML IJ SOAJ INJECTION    SMARTSIG:1 Injection IM PRN   FERROUS SULFATE 325 (65 FE) MG TABLET    Take 325 mg by mouth daily with breakfast.   HYDROCODONE-ACETAMINOPHEN (NORCO) 5-325 MG TABLET    Take 1-2 tablets by mouth every 6 (six) hours as needed for moderate pain.   HYDROXYZINE (ATARAX/VISTARIL) 25 MG TABLET    Take 1 tablet (25 mg total) by mouth every 8 (eight) hours as needed for anxiety or itching.   OXYCODONE-ACETAMINOPHEN (PERCOCET/ROXICET) 5-325 MG TABLET    Take 1-2 tablets by mouth every 4 (four) hours as needed.   PANTOPRAZOLE (PROTONIX) 20 MG TABLET    Take 1 tablet (20 mg total) by mouth daily.   PROAIR RESPICLICK 517 (90 BASE) MCG/ACT AEPB    Inhale 2 puffs into the lungs every 6 (six) hours as needed (SOB, wheezing).   SERTRALINE (ZOLOFT) 100 MG TABLET    Take 1 tablet (100 mg total) by mouth at bedtime. For mood   TRIAMCINOLONE OINTMENT (KENALOG) 0.1 %    Apply 1 application topically 2 (two) times daily as needed (eczema).  Modified Medications   No medications on file  Discontinued Medications   No medications on file    Subjective: Cheyenne Schwartz  is a 26 y.o. F withHistory of eczema, patella subluxation with multiple episodes and failure of conservative care SP medial femoral ligaments in construction.  She underwent MPFL in June,2022. She was progressing  well until she started developing superficial infection around the medial patellofemoral wound.  The area was aspirated and found to have MSSA treated with Keflex x2.  The third time she devolped wound it started spontaneosly drainng.  Taken to the OR on 11/14/21 where she had debridement and hardware removal(medial patellar wound swivelock/sutures and femoral side screw). There was potential involvement of bone per OR note.  Cultures grew MSSA patient placed on 6 weeks of IV cefazolin with end date 12/26/2021. 12/14/21: pt reports her knee feel well. She reports taking only 2 doses of cefazolin about 2-3/week, otherwise she is adherent to three times/day antibiotics. She has eczema and denies worsening of rashes(12/05/21) abs eos 1500. ESR, 14, CRP<1. 01/06/22: Pt reports she is feeling better. Knee feels better, w/o much limitations. Antibiotics stopped on 12/26/21  04/07/22: She never picked up the prescription of cefadroxil. Reprts knee"feels fine".  Today 06/19/22: Reports last dose of cefadroxil was yesterday. Reports she misses a few doses of bid dosing per week.   Review of Systems: Review of Systems  All other systems reviewed and are negative.   Past Medical History:  Diagnosis Date   Anemia    iron   Anxiety    Asthma    Eczema    GERD (gastroesophageal reflux disease)    Palpitations     Social History   Tobacco Use   Smoking status: Never   Smokeless tobacco: Never  Vaping Use   Vaping Use: Never used  Substance Use Topics   Alcohol use: Not Currently    Alcohol/week: 4.0 standard drinks of alcohol    Types: 4 Standard drinks or equivalent per week   Drug use: Never    No family history on file.  Allergies  Allergen Reactions   Chocolate Flavor Hives and Itching    Cocoa Hives   Latex Hives and Itching   Other Hives and Itching    runny nose and Itchy throat   Apple Itching   Fish Allergy Itching   Peanut Oil Itching    Itchy Throat Allergic to most nuts per patient    Bee Pollen Other (See Comments)    Sneezing; runny nose   Pollen Extract    Shellfish Allergy Itching    Itchy throat   Banana Itching    Itchy Throat     Health Maintenance  Topic Date Due   COVID-19 Vaccine (1) Never done   PAP-Cervical Cytology Screening  Never done   PAP SMEAR-Modifier  Never done   INFLUENZA VACCINE  06/27/2022   TETANUS/TDAP  06/27/2026   HPV VACCINES  Completed   Hepatitis C Screening  Completed   HIV Screening  Completed    Objective:  Vitals:   06/19/22 1043  BP: 131/84  Pulse: 89  Temp: 98.4 F (36.9 C)  TempSrc: Oral  SpO2: 99%  Weight: 210 lb (95.3 kg)   Body mass index is 34.95 kg/m.  Physical Exam Constitutional:      Appearance: Normal appearance.  HENT:     Head: Normocephalic and atraumatic.     Right Ear: Tympanic membrane normal.     Left Ear: Tympanic membrane normal.     Nose: Nose normal.     Mouth/Throat:     Mouth: Mucous membranes are moist.  Eyes:     Extraocular Movements: Extraocular movements intact.     Conjunctiva/sclera: Conjunctivae normal.     Pupils: Pupils are equal, round, and reactive to light.  Cardiovascular:     Rate and Rhythm: Normal rate and regular rhythm.     Heart sounds: No murmur heard.    No friction rub. No gallop.  Pulmonary:     Effort: Pulmonary effort is normal.     Breath sounds: Normal breath sounds.  Abdominal:     General: Abdomen is flat.     Palpations: Abdomen is soft.  Musculoskeletal:        General: Normal range of motion.     Comments: Left knee scar non-tender, no erythema  Skin:    General: Skin is warm and dry.  Neurological:     General: No focal deficit present.     Mental Status: She is alert and oriented to person, place, and time.   Psychiatric:        Mood and Affect: Mood normal.     Lab Results Lab Results  Component Value Date   WBC 6.0 04/07/2022   HGB 10.8 (L) 04/07/2022   HCT 35.3 04/07/2022   MCV 79.1 (L) 04/07/2022   PLT 405 (H) 04/07/2022  Lab Results  Component Value Date   CREATININE 0.62 04/07/2022   BUN 10 04/07/2022   NA 137 04/07/2022   K 4.2 04/07/2022   CL 104 04/07/2022   CO2 25 04/07/2022    Lab Results  Component Value Date   ALT 13 04/07/2022   AST 12 04/07/2022   ALKPHOS 54 11/15/2021   BILITOT 0.4 04/07/2022    Lab Results  Component Value Date   CHOL 260 (H) 01/19/2019   HDL 77 01/19/2019   LDLCALC 171 (H) 01/19/2019   TRIG 61 01/19/2019   CHOLHDL 3.4 01/19/2019   No results found for: "LABRPR", "RPRTITER" No results found for: "HIV1RNAQUANT", "HIV1RNAVL", "CD4TABS"   #Infected left medial femoral ligament infection SP hardware removal #OR Cx+ MSSA #Hx of medication non-adherence --Completed cefazolin x 6 week(EOT 12/26/20) -Retained graft in place. Pt never picked up cefadroxil. I discussed that although she is 3 months out from IV antibiotics would still do suppressive antibiotics as she is on Dupixa. -Again, counseled extensively on medication adherence as pt misses a few doses/week. ESR 6, CRP 3 on 04/07/22  Plan -Labs today: cbc, cmp, esr, crp -Continue cefadroxil 500 mg PO bid. Will need suppression for at least 6 months(November, 2023) -Follow-up in 4 months  Laurice Record, MD [facility] [place] [facility] 06/19/2022, 10:46 AM

## 2022-06-20 LAB — COMPLETE METABOLIC PANEL WITH GFR
AG Ratio: 1.2 (calc) (ref 1.0–2.5)
ALT: 13 U/L (ref 6–29)
AST: 13 U/L (ref 10–30)
Albumin: 4.1 g/dL (ref 3.6–5.1)
Alkaline phosphatase (APISO): 79 U/L (ref 31–125)
BUN: 8 mg/dL (ref 7–25)
CO2: 23 mmol/L (ref 20–32)
Calcium: 9.3 mg/dL (ref 8.6–10.2)
Chloride: 103 mmol/L (ref 98–110)
Creat: 0.68 mg/dL (ref 0.50–0.96)
Globulin: 3.3 g/dL (calc) (ref 1.9–3.7)
Glucose, Bld: 104 mg/dL — ABNORMAL HIGH (ref 65–99)
Potassium: 3.5 mmol/L (ref 3.5–5.3)
Sodium: 137 mmol/L (ref 135–146)
Total Bilirubin: 0.4 mg/dL (ref 0.2–1.2)
Total Protein: 7.4 g/dL (ref 6.1–8.1)
eGFR: 125 mL/min/{1.73_m2} (ref >=60)

## 2022-06-20 LAB — CBC WITH DIFFERENTIAL/PLATELET
Absolute Monocytes: 553 cells/uL (ref 200–950)
Basophils Absolute: 49 cells/uL (ref 0–200)
Basophils Relative: 0.7 %
Eosinophils Absolute: 595 cells/uL — ABNORMAL HIGH (ref 15–500)
Eosinophils Relative: 8.5 %
HCT: 34.2 % — ABNORMAL LOW (ref 35.0–45.0)
Hemoglobin: 10.9 g/dL — ABNORMAL LOW (ref 11.7–15.5)
Lymphs Abs: 2051 cells/uL (ref 850–3900)
MCH: 23.5 pg — ABNORMAL LOW (ref 27.0–33.0)
MCHC: 31.9 g/dL — ABNORMAL LOW (ref 32.0–36.0)
MCV: 73.9 fL — ABNORMAL LOW (ref 80.0–100.0)
MPV: 10.5 fL (ref 7.5–12.5)
Monocytes Relative: 7.9 %
Neutro Abs: 3752 cells/uL (ref 1500–7800)
Neutrophils Relative %: 53.6 %
Platelets: 420 10*3/uL — ABNORMAL HIGH (ref 140–400)
RBC: 4.63 10*6/uL (ref 3.80–5.10)
RDW: 14.2 % (ref 11.0–15.0)
Total Lymphocyte: 29.3 %
WBC: 7 10*3/uL (ref 3.8–10.8)

## 2022-06-20 LAB — C-REACTIVE PROTEIN: CRP: 2.4 mg/L (ref <8.0)

## 2022-06-20 LAB — SEDIMENTATION RATE: Sed Rate: 9 mm/h (ref 0–20)

## 2022-10-23 ENCOUNTER — Ambulatory Visit: Payer: BC Managed Care – PPO | Admitting: Internal Medicine

## 2022-11-29 ENCOUNTER — Encounter: Payer: Self-pay | Admitting: Internal Medicine

## 2022-11-29 ENCOUNTER — Ambulatory Visit (INDEPENDENT_AMBULATORY_CARE_PROVIDER_SITE_OTHER): Payer: BC Managed Care – PPO

## 2022-11-29 ENCOUNTER — Ambulatory Visit: Payer: BC Managed Care – PPO | Admitting: Internal Medicine

## 2022-11-29 ENCOUNTER — Other Ambulatory Visit: Payer: Self-pay

## 2022-11-29 VITALS — BP 128/84 | HR 66 | Temp 97.5°F | Wt 214.0 lb

## 2022-11-29 DIAGNOSIS — Z23 Encounter for immunization: Secondary | ICD-10-CM | POA: Diagnosis not present

## 2022-11-29 DIAGNOSIS — B9561 Methicillin susceptible Staphylococcus aureus infection as the cause of diseases classified elsewhere: Secondary | ICD-10-CM | POA: Diagnosis not present

## 2022-11-29 DIAGNOSIS — T8454XA Infection and inflammatory reaction due to internal left knee prosthesis, initial encounter: Secondary | ICD-10-CM

## 2022-11-29 NOTE — Progress Notes (Signed)
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   CEFADROXIL (DURICEF) 500 MG CAPSULE    Take 1 capsule (500 mg total) by mouth 2 (two) times daily.   CETIRIZINE (ZYRTEC) 10 MG TABLET    Take 10 mg by mouth daily as needed for allergies.   DUPIXENT 300 MG/2ML PREFILLED SYRINGE    Inject 300 mg into the skin every 14 (fourteen) days.   EPINEPHRINE 0.3 MG/0.3 ML IJ SOAJ INJECTION    SMARTSIG:1 Injection IM PRN   FERROUS SULFATE 325 (65 FE) MG TABLET    Take 325 mg by mouth daily with breakfast.   HYDROCODONE-ACETAMINOPHEN (NORCO) 5-325 MG TABLET    Take 1-2 tablets by mouth every 6 (six) hours as needed for moderate pain.   HYDROXYZINE (ATARAX/VISTARIL) 25 MG TABLET    Take 1 tablet (25 mg total) by mouth every 8 (eight) hours as needed for anxiety or itching.   OXYCODONE-ACETAMINOPHEN (PERCOCET/ROXICET) 5-325 MG TABLET    Take 1-2 tablets by mouth every 4 (four) hours as needed.   PANTOPRAZOLE (PROTONIX) 20 MG TABLET    Take 1 tablet (20 mg total) by mouth daily.   PROAIR RESPICLICK 409 (90 BASE) MCG/ACT AEPB    Inhale 2 puffs into the lungs every 6 (six) hours as needed (SOB, wheezing).   SERTRALINE (ZOLOFT) 100 MG TABLET    Take 1 tablet (100 mg total) by mouth at bedtime. For mood   TRIAMCINOLONE OINTMENT (KENALOG) 0.1 %    Apply 1 application topically 2 (two) times daily as needed (eczema).  Modified Medications   No medications on file  Discontinued Medications   No medications on file    Subjective: Cheyenne Schwartz  is a 27 y.o. F withHistory of eczema, patella subluxation with multiple episodes and failure of conservative care SP medial femoral ligaments in construction.  She underwent MPFL in June,2022. She was progressing  well until she started developing superficial infection around the medial patellofemoral wound.  The area was aspirated and found to have MSSA treated with Keflex x2.  The third time she devolped wound it started spontaneosly drainng.  Taken to the OR on 11/14/21 where she had debridement and hardware removal(medial patellar wound swivelock/sutures and femoral side screw). There was potential involvement of bone per OR note.  Cultures grew MSSA patient placed on 6 weeks of IV cefazolin with end date 12/26/2021. 12/14/21: pt reports her knee feel well. She reports taking only 2 doses of cefazolin about 2-3/week, otherwise she is adherent to three times/day antibiotics. She has eczema and denies worsening of rashes(12/05/21) abs eos 1500. ESR, 14, CRP<1. 01/06/22: Pt reports she is feeling better. Knee feels better, w/o much limitations. Antibiotics stopped on 12/26/21  04/07/22: She never picked up the prescription of cefadroxil. Reprts knee"feels fine".   06/19/22: Reports last dose of cefadroxil was yesterday. Reports she misses a few doses of bid dosing per week.   Today 11/29/22: She states that she  did not pick up suppressive antibiotics. Denies knee pain, fever or chills.  Review of Systems: Review of Systems  All other systems reviewed and are negative.   Past Medical History:  Diagnosis Date   Anemia    iron   Anxiety    Asthma    Eczema    GERD (gastroesophageal reflux disease)    Palpitations     Social History   Tobacco Use   Smoking status: Never   Smokeless tobacco: Never  Vaping Use   Vaping Use: Never used  Substance Use Topics   Alcohol use: Not Currently    Alcohol/week: 4.0 standard drinks of alcohol    Types: 4 Standard drinks or equivalent per week   Drug use:  Never    No family history on file.  Allergies  Allergen Reactions   Chocolate Flavor Hives and Itching   Cocoa Hives   Latex Hives and Itching   Other Hives and Itching    runny nose and Itchy throat   Apple Itching   Fish Allergy Itching   Peanut Oil Itching    Itchy Throat Allergic to most nuts per patient    Bee Pollen Other (See Comments)    Sneezing; runny nose   Pollen Extract    Shellfish Allergy Itching    Itchy throat   Banana Itching    Itchy Throat     Health Maintenance  Topic Date Due   COVID-19 Vaccine (1) Never done   PAP-Cervical Cytology Screening  Never done   PAP SMEAR-Modifier  Never done   INFLUENZA VACCINE  06/27/2022   DTaP/Tdap/Td (3 - Td or Tdap) 06/27/2026   HPV VACCINES  Completed   Hepatitis C Screening  Completed   HIV Screening  Completed    Objective:  There were no vitals filed for this visit. There is no height or weight on file to calculate BMI.  Physical Exam Constitutional:      Appearance: Normal appearance.  HENT:     Head: Normocephalic and atraumatic.     Right Ear: Tympanic membrane normal.     Left Ear: Tympanic membrane normal.     Nose: Nose normal.     Mouth/Throat:     Mouth: Mucous membranes are moist.  Eyes:     Extraocular Movements: Extraocular movements intact.     Conjunctiva/sclera: Conjunctivae normal.     Pupils: Pupils are equal, round, and reactive to light.  Cardiovascular:     Rate and Rhythm: Normal rate and regular rhythm.     Heart sounds: No murmur heard.    No friction rub. No gallop.  Pulmonary:     Effort: Pulmonary effort is normal.     Breath sounds: Normal breath sounds.  Abdominal:     General: Abdomen is flat.     Palpations: Abdomen is soft.  Musculoskeletal:        General: Normal range of motion.     Comments: Left knee scar  Skin:    General: Skin is warm and dry.  Neurological:     General: No focal deficit present.     Mental Status: She is alert and oriented to  person, place, and time.  Psychiatric:        Mood and Affect: Mood normal.     Lab Results Lab Results  Component Value Date   WBC 7.0 06/19/2022   HGB 10.9 (L) 06/19/2022   HCT 34.2 (L) 06/19/2022   MCV 73.9 (L) 06/19/2022   PLT  420 (H) 06/19/2022    Lab Results  Component Value Date   CREATININE 0.68 06/19/2022   BUN 8 06/19/2022   NA 137 06/19/2022   K 3.5 06/19/2022   CL 103 06/19/2022   CO2 23 06/19/2022    Lab Results  Component Value Date   ALT 13 06/19/2022   AST 13 06/19/2022   ALKPHOS 54 11/15/2021   BILITOT 0.4 06/19/2022    Lab Results  Component Value Date   CHOL 260 (H) 01/19/2019   HDL 77 01/19/2019   LDLCALC 171 (H) 01/19/2019   TRIG 61 01/19/2019   CHOLHDL 3.4 01/19/2019   No results found for: "LABRPR", "RPRTITER" No results found for: "HIV1RNAQUANT", "HIV1RNAVL", "CD4TABS"   Assessment/Plan: #Infected left medial femoral ligament infection SP hardware removal #OR Cx+ MSSA #Hx of medication non-adherence --Completed cefazolin x 6 week(EOT 12/26/20) -Retained piece of graft in place. Pt never picked up cefadroxil. I discussed that although she is 3 months out from IV antibiotics would still do suppressive antibiotics as she is on Dupixa. -06/19/2022 WBC 7K SCR 0.68, ESR 9, CRP 2.4 -Patient did not restart antibiotics since last visit in July.  I advised her since she has a piece of distal remaining graft, The plan was to continue suppressive antibiotics for the 6 months.  She declines suppressive antibiotics despite being counseled on potential dangers of infection returning.  Will monitor patient off antibiotics with yearly labs. -Labs today: cbc, cmp, esr, crp -Follow-up in one year. Counseled to call clinic if Sx of knee pain, swelling return.      Laurice Record, MD [facility] [place] [facility] 11/29/2022, 8:42 AM

## 2022-11-30 LAB — C-REACTIVE PROTEIN: CRP: 4.1 mg/L (ref <8.0)

## 2022-11-30 LAB — CBC WITH DIFFERENTIAL/PLATELET
Absolute Monocytes: 645 cells/uL (ref 200–950)
Basophils Absolute: 53 cells/uL (ref 0–200)
Basophils Relative: 0.7 %
Eosinophils Absolute: 758 cells/uL — ABNORMAL HIGH (ref 15–500)
Eosinophils Relative: 10.1 %
HCT: 31.9 % — ABNORMAL LOW (ref 35.0–45.0)
Hemoglobin: 10 g/dL — ABNORMAL LOW (ref 11.7–15.5)
Lymphs Abs: 2228 cells/uL (ref 850–3900)
MCH: 22.7 pg — ABNORMAL LOW (ref 27.0–33.0)
MCHC: 31.3 g/dL — ABNORMAL LOW (ref 32.0–36.0)
MCV: 72.3 fL — ABNORMAL LOW (ref 80.0–100.0)
MPV: 10 fL (ref 7.5–12.5)
Monocytes Relative: 8.6 %
Neutro Abs: 3818 cells/uL (ref 1500–7800)
Neutrophils Relative %: 50.9 %
Platelets: 433 10*3/uL — ABNORMAL HIGH (ref 140–400)
RBC: 4.41 10*6/uL (ref 3.80–5.10)
RDW: 13.8 % (ref 11.0–15.0)
Total Lymphocyte: 29.7 %
WBC: 7.5 10*3/uL (ref 3.8–10.8)

## 2022-11-30 LAB — COMPREHENSIVE METABOLIC PANEL
AG Ratio: 1.3 (calc) (ref 1.0–2.5)
ALT: 16 U/L (ref 6–29)
AST: 16 U/L (ref 10–30)
Albumin: 4.1 g/dL (ref 3.6–5.1)
Alkaline phosphatase (APISO): 82 U/L (ref 31–125)
BUN: 10 mg/dL (ref 7–25)
CO2: 27 mmol/L (ref 20–32)
Calcium: 9 mg/dL (ref 8.6–10.2)
Chloride: 102 mmol/L (ref 98–110)
Creat: 0.67 mg/dL (ref 0.50–0.96)
Globulin: 3.2 g/dL (calc) (ref 1.9–3.7)
Glucose, Bld: 78 mg/dL (ref 65–99)
Potassium: 4.2 mmol/L (ref 3.5–5.3)
Sodium: 135 mmol/L (ref 135–146)
Total Bilirubin: 0.5 mg/dL (ref 0.2–1.2)
Total Protein: 7.3 g/dL (ref 6.1–8.1)

## 2022-11-30 LAB — SEDIMENTATION RATE: Sed Rate: 14 mm/h (ref 0–20)

## 2023-06-19 ENCOUNTER — Encounter (HOSPITAL_BASED_OUTPATIENT_CLINIC_OR_DEPARTMENT_OTHER): Payer: Self-pay | Admitting: Advanced Practice Midwife

## 2023-06-19 ENCOUNTER — Ambulatory Visit (HOSPITAL_BASED_OUTPATIENT_CLINIC_OR_DEPARTMENT_OTHER): Payer: BC Managed Care – PPO | Admitting: Advanced Practice Midwife

## 2023-06-19 VITALS — BP 122/75 | HR 74 | Ht 65.0 in | Wt 210.8 lb

## 2023-06-19 DIAGNOSIS — Z01419 Encounter for gynecological examination (general) (routine) without abnormal findings: Secondary | ICD-10-CM

## 2023-06-19 DIAGNOSIS — N942 Vaginismus: Secondary | ICD-10-CM | POA: Diagnosis not present
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
## 2023-07-06 ENCOUNTER — Ambulatory Visit: Payer: BC Managed Care – PPO | Attending: Advanced Practice Midwife | Admitting: Physical Therapy

## 2023-07-06 ENCOUNTER — Ambulatory Visit: Payer: BC Managed Care – PPO | Admitting: Physician Assistant

## 2023-07-06 DIAGNOSIS — R293 Abnormal posture: Secondary | ICD-10-CM | POA: Insufficient documentation

## 2023-07-06 DIAGNOSIS — M6281 Muscle weakness (generalized): Secondary | ICD-10-CM | POA: Insufficient documentation

## 2023-07-06 DIAGNOSIS — M62838 Other muscle spasm: Secondary | ICD-10-CM | POA: Insufficient documentation

## 2023-07-06 DIAGNOSIS — R279 Unspecified lack of coordination: Secondary | ICD-10-CM | POA: Insufficient documentation
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
## 2023-07-07 ENCOUNTER — Inpatient Hospital Stay: Payer: BC Managed Care – PPO

## 2023-07-07 ENCOUNTER — Encounter: Payer: Self-pay | Admitting: Internal Medicine

## 2023-07-07 ENCOUNTER — Inpatient Hospital Stay: Payer: BC Managed Care – PPO | Attending: Physician Assistant | Admitting: Internal Medicine

## 2023-07-07 ENCOUNTER — Other Ambulatory Visit: Payer: Self-pay | Admitting: Internal Medicine

## 2023-07-07 VITALS — BP 124/98 | HR 92 | Temp 98.1°F | Resp 18 | Ht 66.0 in | Wt 214.4 lb

## 2023-07-07 DIAGNOSIS — D500 Iron deficiency anemia secondary to blood loss (chronic): Secondary | ICD-10-CM | POA: Diagnosis present

## 2023-07-07 DIAGNOSIS — D539 Nutritional anemia, unspecified: Secondary | ICD-10-CM

## 2023-07-07 DIAGNOSIS — N920 Excessive and frequent menstruation with regular cycle: Secondary | ICD-10-CM | POA: Diagnosis not present

## 2023-07-07 LAB — CMP (CANCER CENTER ONLY)
ALT: 15 U/L (ref 0–44)
AST: 13 U/L — ABNORMAL LOW (ref 15–41)
Albumin: 4 g/dL (ref 3.5–5.0)
Alkaline Phosphatase: 71 U/L (ref 38–126)
Anion gap: 7 (ref 5–15)
BUN: 11 mg/dL (ref 6–20)
CO2: 26 mmol/L (ref 22–32)
Calcium: 8.9 mg/dL (ref 8.9–10.3)
Chloride: 103 mmol/L (ref 98–111)
Creatinine: 0.73 mg/dL (ref 0.44–1.00)
GFR, Estimated: >60 mL/min (ref >60)
Glucose, Bld: 89 mg/dL (ref 70–99)
Potassium: 3.9 mmol/L (ref 3.5–5.1)
Sodium: 136 mmol/L (ref 135–145)
Total Bilirubin: 0.3 mg/dL (ref 0.3–1.2)
Total Protein: 6.9 g/dL (ref 6.5–8.1)

## 2023-07-07 LAB — IRON AND IRON BINDING CAPACITY (CC-WL,HP ONLY)
Iron: 31 ug/dL (ref 28–170)
Saturation Ratios: 8 % — ABNORMAL LOW (ref 10.4–31.8)
TIBC: 412 ug/dL (ref 250–450)
UIBC: 381 ug/dL (ref 148–442)

## 2023-07-07 LAB — CBC WITH DIFFERENTIAL (CANCER CENTER ONLY)
Abs Immature Granulocytes: 0.02 10*3/uL (ref 0.00–0.07)
Basophils Absolute: 0 10*3/uL (ref 0.0–0.1)
Basophils Relative: 0 %
Eosinophils Absolute: 0.4 10*3/uL (ref 0.0–0.5)
Eosinophils Relative: 5 %
HCT: 37.5 % (ref 36.0–46.0)
Hemoglobin: 12.3 g/dL (ref 12.0–15.0)
Immature Granulocytes: 0 %
Lymphocytes Relative: 34 %
Lymphs Abs: 2.5 10*3/uL (ref 0.7–4.0)
MCH: 26.8 pg (ref 26.0–34.0)
MCHC: 32.8 g/dL (ref 30.0–36.0)
MCV: 81.7 fL (ref 80.0–100.0)
Monocytes Absolute: 0.7 10*3/uL (ref 0.1–1.0)
Monocytes Relative: 9 %
Neutro Abs: 3.9 10*3/uL (ref 1.7–7.7)
Neutrophils Relative %: 52 %
Platelet Count: 341 10*3/uL (ref 150–400)
RBC: 4.59 MIL/uL (ref 3.87–5.11)
RDW: 12.3 % (ref 11.5–15.5)
WBC Count: 7.5 10*3/uL (ref 4.0–10.5)
nRBC: 0 % (ref 0.0–0.2)

## 2023-07-07 LAB — VITAMIN B12: Vitamin B-12: 307 pg/mL (ref 180–914)

## 2023-07-07 LAB — FOLATE: Folate: 8.8 ng/mL (ref >5.9)

## 2023-07-07 LAB — TSH: TSH: 1.317 u[IU]/mL (ref 0.350–4.500)

## 2023-07-07 LAB — FERRITIN: Ferritin: 20 ng/mL (ref 11–307)

## 2023-07-07 NOTE — Progress Notes (Signed)
[facility] [phone]   [phone]  CONSULT NOTE  REFERRING PHYSICIAN: Dr. Amil Amen Sheets  REASON FOR CONSULTATION:  27 years old African-American female with iron deficiency anemia  HPI Cheyenne Schwartz is a 27 y.o. female with past medical history significant for anxiety, asthma, eczema, GERD as well as heart palpitations.  The patient also has a long history of iron deficiency anemia secondary to menorrhagia.  She was seen in the past at [facility] for the same problem.  She was treated with iron infusion with Venofer between February 21, 2021 and March 17, 2021.  She has a history of menorrhagia but not following closely with a gynecologist.  The patient lives in [place] and her previous hematologist at [facility] has left the practice.  She is transitioning her care to [place] at [facility] for close monitoring and management of her condition.  She was seen by midwife at the gynecology service but has not started any treatment for her menorrhagia yet.  Her menstruation last around 7 to 8 days with 4 to 5 days of heavy bleeding and clots. When seen today she is feeling fine except fatigue.  She has some craving for ice.  She has been on oral iron tablets daily since March 2024.  She denied having any chest pain, shortness of breath, cough or hemoptysis.  She has no bleeding, bruises or ecchymosis.  She has no nausea, vomiting, diarrhea or constipation.  She has no headache or visual changes. Family history significant for father with hypertension and osteoarthritis.  Mother had diabetes and sister had myasthenia gravis. The patient is single and has no children.  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  She has no history of smoking and drinks alcohol occasion with no history of drug abuse.  HPI  Past Medical History:  Diagnosis Date   Anemia    iron   Anxiety    Asthma    Eczema    GERD (gastroesophageal reflux  disease)    Palpitations     Past Surgical History:  Procedure Laterality Date   IRRIGATION AND DEBRIDEMENT KNEE Left 11/14/2021   Procedure: IRRIGATION AND DEBRIDEMENT KNEE AND HARDWARE REMOVAL;  Surgeon: Jodi Geralds, MD;  Location: [facility];  Service: Orthopedics;  Laterality: Left;   KNEE ARTHROSCOPY Left    UPPER GI ENDOSCOPY     WISDOM TOOTH EXTRACTION      Family History  Problem Relation Age of Onset   Hypertension Father    Arthritis Father    Diabetes Mother    Epilepsy Sister    Myasthenia gravis Sister     Social History Social History   Tobacco Use   Smoking status: Never   Smokeless tobacco: Never  Vaping Use   Vaping status: Never Used  Substance Use Topics   Alcohol use: Yes    Alcohol/week: 4.0 standard drinks of alcohol    Types: 4 Standard drinks or equivalent per week    Comment: occassional   Drug use: Never    Allergies  Allergen Reactions   Chocolate Flavor Hives and Itching   Cocoa Hives   Latex Hives and Itching   Other Hives and Itching    runny nose and Itchy throat   Apple Itching   Fish Allergy Itching   Peanut Oil Itching    Itchy Throat Allergic to most nuts per patient    Bee Pollen Other (See Comments)  Sneezing; runny nose   Pollen Extract    Shellfish Allergy Itching    Itchy throat   Banana Itching    Itchy Throat     Current Outpatient Medications  Medication Sig Dispense Refill   Ascorbic Acid (VITAMIN C GUMMIE PO) Take 2 tablets by mouth daily.     cetirizine (ZYRTEC) 10 MG tablet Take 10 mg by mouth daily as needed for allergies.     DUPIXENT 300 MG/2ML prefilled syringe Inject 300 mg into the skin every 14 (fourteen) days.     EPINEPHrine 0.3 mg/0.3 mL IJ SOAJ injection      ferrous sulfate 325 (65 FE) MG tablet Take 325 mg by mouth daily with breakfast.     hydrOXYzine (ATARAX/VISTARIL) 25 MG tablet Take 1 tablet (25 mg total) by mouth every 8 (eight) hours as needed for anxiety or itching. (Patient not  taking: Reported on 11/29/2022) 60 tablet 1   PROAIR RESPICLICK 108 (90 Base) MCG/ACT AEPB Inhale 2 puffs into the lungs every 6 (six) hours as needed (SOB, wheezing).     sertraline (ZOLOFT) 100 MG tablet Take 1 tablet (100 mg total) by mouth at bedtime. For mood 30 tablet 0   triamcinolone ointment (KENALOG) 0.1 % Apply 1 application topically 2 (two) times daily as needed (eczema).     No current facility-administered medications for this visit.    Review of Systems  Constitutional: positive for fatigue Eyes: negative Ears, nose, mouth, throat, and face: negative Respiratory: negative Cardiovascular: negative Gastrointestinal: negative Genitourinary:negative Integument/breast: negative Hematologic/lymphatic: negative Musculoskeletal:negative Neurological: negative Behavioral/Psych: negative Endocrine: negative Allergic/Immunologic: negative  Physical Exam  BMW:[id number], no distress, well nourished, and well developed SKIN: skin color, texture, turgor are normal, no rashes or significant lesions HEAD: Normocephalic, No masses, lesions, tenderness or abnormalities EYES: normal, PERRLA, Conjunctiva are pink and non-injected EARS: External ears normal, Canals clear OROPHARYNX:no exudate, no erythema, and lips, buccal mucosa, and tongue normal  NECK: supple, no adenopathy, no JVD LYMPH:  no palpable lymphadenopathy, no hepatosplenomegaly BREAST:not examined LUNGS: clear to auscultation , and palpation HEART: regular rate & rhythm, no murmurs, and no gallops ABDOMEN:abdomen soft, non-tender, normal bowel sounds, and no masses or organomegaly BACK: Back symmetric, no curvature., No CVA tenderness EXTREMITIES:no joint deformities, effusion, or inflammation, no edema  NEURO: alert & oriented x 3 with fluent speech, no focal motor/sensory deficits  PERFORMANCE STATUS: ECOG 0  LABORATORY DATA: Lab Results  Component Value Date   WBC 7.5 11/29/2022   HGB 10.0 (L) 11/29/2022    HCT 31.9 (L) 11/29/2022   MCV 72.3 (L) 11/29/2022   PLT 433 (H) 11/29/2022      Chemistry      Component Value Date/Time   NA 135 11/29/2022 1005   K 4.2 11/29/2022 1005   CL 102 11/29/2022 1005   CO2 27 11/29/2022 1005   BUN 10 11/29/2022 1005   CREATININE 0.67 11/29/2022 1005      Component Value Date/Time   CALCIUM 9.0 11/29/2022 1005   ALKPHOS 54 11/15/2021 1519   AST 16 11/29/2022 1005   ALT 16 11/29/2022 1005   BILITOT 0.5 11/29/2022 1005       RADIOGRAPHIC STUDIES: No results found.  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  The patient was treated in the past in 2022 with iron infusion with Venofer and she felt much better.  She is currently on over-the-counter iron tablet daily since March 2024.  PLAN: I had a lengthy discussion with the patient today about her condition and treatment options. I ordered several studies today including repeat CBC that showed normal hemoglobin of 12.3 and hematocrit 37.5 with normal total white blood count of 7.5 and platelets 341.  Other studies including iron study, comprehensive metabolic panel, ferritin, vitamin B12, serum folate and TSH are still pending. I recommended for the patient to continue on the oral iron supplement with vitamin C or orange juice every other day for now. If she has significant iron deficiency, I will arrange for the patient to receive iron infusion in the interval. I will see her back for follow-up visit in 3 months with repeat blood work. She was advised to call immediately if she has any concerning symptoms in the interval. The patient voices understanding of current disease status and treatment options and is in agreement with the current care plan.  All questions were answered. The patient knows to call the clinic with any problems, questions or concerns. We can certainly see the patient much sooner if  necessary.  Thank you so much for allowing me to participate in the care of Cheyenne Schwartz. I will continue to follow up the patient with you and assist in her care.  The total time spent in the appointment was 60 minutes.  Disclaimer: This note was dictated with voice recognition software. Similar sounding words can inadvertently be transcribed and may not be corrected upon review.   Lajuana Matte July 07, 2023, 8:21 AM

## 2023-07-11 ENCOUNTER — Inpatient Hospital Stay: Payer: BC Managed Care – PPO

## 2023-07-11 ENCOUNTER — Inpatient Hospital Stay: Payer: BC Managed Care – PPO | Admitting: Physician Assistant

## 2023-07-11 ENCOUNTER — Other Ambulatory Visit: Payer: Self-pay | Admitting: Oncology

## 2023-07-11 DIAGNOSIS — Z006 Encounter for examination for normal comparison and control in clinical research program: Secondary | ICD-10-CM

## 2023-07-16 ENCOUNTER — Ambulatory Visit: Payer: BC Managed Care – PPO | Admitting: Physical Therapy

## 2023-07-16 DIAGNOSIS — M62838 Other muscle spasm: Secondary | ICD-10-CM | POA: Diagnosis not present

## 2023-07-16 DIAGNOSIS — R293 Abnormal posture: Secondary | ICD-10-CM

## 2023-07-16 DIAGNOSIS — R279 Unspecified lack of coordination: Secondary | ICD-10-CM
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
## 2023-07-25 ENCOUNTER — Ambulatory Visit: Payer: BC Managed Care – PPO | Admitting: Physical Therapy

## 2023-08-02 ENCOUNTER — Ambulatory Visit: Payer: BC Managed Care – PPO | Attending: Advanced Practice Midwife | Admitting: Physical Therapy

## 2023-08-02 DIAGNOSIS — M62838 Other muscle spasm: Secondary | ICD-10-CM | POA: Diagnosis present

## 2023-08-02 DIAGNOSIS — R293 Abnormal posture: Secondary | ICD-10-CM | POA: Diagnosis present

## 2023-08-02 DIAGNOSIS — R279 Unspecified lack of coordination: Secondary | ICD-10-CM | POA: Diagnosis present
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
## 2023-08-08 ENCOUNTER — Encounter: Payer: BC Managed Care – PPO | Admitting: Physical Therapy

## 2023-08-21 ENCOUNTER — Encounter: Payer: Self-pay | Admitting: Physical Therapy

## 2023-08-29 ENCOUNTER — Ambulatory Visit: Payer: BC Managed Care – PPO | Attending: Advanced Practice Midwife | Admitting: Physical Therapy

## 2023-08-29 DIAGNOSIS — M62838 Other muscle spasm: Secondary | ICD-10-CM | POA: Diagnosis present

## 2023-08-29 DIAGNOSIS — R279 Unspecified lack of coordination: Secondary | ICD-10-CM | POA: Insufficient documentation

## 2023-08-29 DIAGNOSIS — R293 Abnormal posture: Secondary | ICD-10-CM | POA: Diagnosis present
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
## 2023-09-19 ENCOUNTER — Ambulatory Visit: Payer: BC Managed Care – PPO | Admitting: Physical Therapy

## 2023-09-26 ENCOUNTER — Ambulatory Visit: Payer: BC Managed Care – PPO | Admitting: Physical Therapy

## 2023-09-26 DIAGNOSIS — R279 Unspecified lack of coordination: Secondary | ICD-10-CM

## 2023-09-26 DIAGNOSIS — R293 Abnormal posture: Secondary | ICD-10-CM

## 2023-09-26 DIAGNOSIS — M62838 Other muscle spasm: Secondary | ICD-10-CM
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
## 2023-10-03 ENCOUNTER — Ambulatory Visit: Payer: BC Managed Care – PPO | Admitting: Physical Therapy

## 2023-10-10 ENCOUNTER — Encounter: Payer: Self-pay | Admitting: Physical Therapy

## 2023-10-10 ENCOUNTER — Ambulatory Visit: Payer: BC Managed Care – PPO | Attending: Advanced Practice Midwife | Admitting: Physical Therapy

## 2023-10-10 DIAGNOSIS — M6281 Muscle weakness (generalized): Secondary | ICD-10-CM

## 2023-10-10 DIAGNOSIS — M62838 Other muscle spasm: Secondary | ICD-10-CM | POA: Diagnosis present

## 2023-10-10 DIAGNOSIS — R293 Abnormal posture: Secondary | ICD-10-CM | POA: Diagnosis present

## 2023-10-10 DIAGNOSIS — R279 Unspecified lack of coordination: Secondary | ICD-10-CM
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
## 2023-10-17 ENCOUNTER — Ambulatory Visit: Payer: BC Managed Care – PPO | Admitting: Physical Therapy

## 2023-10-17 DIAGNOSIS — M62838 Other muscle spasm: Secondary | ICD-10-CM | POA: Diagnosis not present

## 2023-10-17 DIAGNOSIS — R293 Abnormal posture: Secondary | ICD-10-CM

## 2023-10-17 DIAGNOSIS — R279 Unspecified lack of coordination: Secondary | ICD-10-CM
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
## 2023-10-18 ENCOUNTER — Telehealth: Payer: Self-pay | Admitting: Internal Medicine

## 2023-10-18 ENCOUNTER — Inpatient Hospital Stay: Payer: BC Managed Care – PPO | Admitting: Internal Medicine

## 2023-10-18 ENCOUNTER — Inpatient Hospital Stay: Payer: BC Managed Care – PPO

## 2023-10-22 ENCOUNTER — Ambulatory Visit: Payer: BC Managed Care – PPO | Admitting: Physical Therapy

## 2023-11-06 ENCOUNTER — Encounter (HOSPITAL_COMMUNITY): Payer: Self-pay

## 2023-11-06 ENCOUNTER — Other Ambulatory Visit: Payer: Self-pay

## 2023-11-06 ENCOUNTER — Other Ambulatory Visit (HOSPITAL_COMMUNITY): Payer: Self-pay

## 2023-11-06 MED ORDER — DUPIXENT 300 MG/2ML ~~LOC~~ SOSY: PREFILLED_SYRINGE | SUBCUTANEOUS | 11 refills | Status: DC

## 2023-11-07 ENCOUNTER — Telehealth: Payer: Self-pay | Admitting: Pharmacist

## 2023-11-07 ENCOUNTER — Other Ambulatory Visit (HOSPITAL_COMMUNITY): Payer: Self-pay

## 2023-11-07 ENCOUNTER — Other Ambulatory Visit: Payer: Self-pay

## 2023-11-07 NOTE — Telephone Encounter (Signed)
Called patient to schedule an appointment for the [facility]. I was unable to reach the patient so I left a HIPAA-compliant message requesting that the patient return my call.   Luke Van Ausdall, PharmD, BCACP, CPP Clinical Pharmacist [facility] [phone]  

## 2023-11-08 ENCOUNTER — Ambulatory Visit: Payer: 59 | Admitting: Physical Therapy

## 2023-11-08 NOTE — Progress Notes (Signed)
[facility] OFFICE PROGRESS NOTE  System, Provider Not In No address on file  DIAGNOSIS: iron deficiency anemia secondary to menorrhagia.   PRIOR THERAPY: 1) IV iron with venofer PRN, most recent dose ~2022 in [place]  CURRENT THERAPY: 2) Oral iron supplement with OTC ferrous sulfate every other day with vitamin C   INTERVAL HISTORY: Cheyenne Schwartz 27 y.o. female returns to the clinic today for a follow-up visit.  She was last seen by Dr. Arbutus Ped in August 2024 to establish care.  The patient is followed for her history of iron deficiency anemia.  She has not had any iron infusions in several years.  She is compliant with her oral iron supplement p.o.  every other day.  Since last being seen she denies any major changes in her health.   Overall, she is feeling "pretty good".  When she has worsening iron deficiency, she reports craving ice chips.  She states she is not craving any ice chips at this time.  Her energy is up-and-down but overall fairly good at this time.  She denies any shortness of breath.  Denies any other abnormal bleeding or bruising.  She still continues to have heavy menstrual cycles.  Her menstrual periods last 7 days with associated clots.  She feels like her menstrual cycles have been heavier recently.  She saw her PCP and GYN who recommended birth control but she opted to think about her options. She is trying to increase her iron rich food and spinach. Otherwise she denies any other abnormal bleeding or bruising.  Denies any lightheadedness or syncope.  She is here for evaluation and repeat blood cancer.     MEDICAL HISTORY: Past Medical History:  Diagnosis Date   Anemia    iron   Anxiety    Asthma    Eczema    GERD (gastroesophageal reflux disease)    Palpitations     ALLERGIES:  is allergic to chocolate flavoring agent (non-screening), cocoa, latex, other, peanut oil, shellfish allergy, apple, apple fruit extract, bee pollen, fish allergy,  fish-derived products, peanut-containing drug products, pollen extract, short ragweed pollen ext, and banana.  MEDICATIONS:  Current Outpatient Medications  Medication Sig Dispense Refill   Ascorbic Acid (VITAMIN C GUMMIE PO) Take 2 tablets by mouth daily.     cetirizine (ZYRTEC) 10 MG tablet Take 10 mg by mouth daily as needed for allergies.     dupilumab (DUPIXENT) 300 MG/2ML prefilled syringe Inject 1 syringe into the skin every other week. 4 mL 11   EPINEPHrine 0.3 mg/0.3 mL IJ SOAJ injection      ferrous sulfate 325 (65 FE) MG tablet Take 325 mg by mouth daily with breakfast.     hydrOXYzine (ATARAX/VISTARIL) 25 MG tablet Take 1 tablet (25 mg total) by mouth every 8 (eight) hours as needed for anxiety or itching. (Patient not taking: Reported on 11/29/2022) 60 tablet 1   PROAIR RESPICLICK 108 (90 Base) MCG/ACT AEPB Inhale 2 puffs into the lungs every 6 (six) hours as needed (SOB, wheezing).     sertraline (ZOLOFT) 100 MG tablet Take 1 tablet (100 mg total) by mouth at bedtime. For mood 30 tablet 0   triamcinolone ointment (KENALOG) 0.1 % Apply 1 application topically 2 (two) times daily as needed (eczema).     No current facility-administered medications for this visit.    SURGICAL HISTORY:  Past Surgical History:  Procedure Laterality Date   IRRIGATION AND DEBRIDEMENT KNEE Left 11/14/2021   Procedure: IRRIGATION AND  DEBRIDEMENT KNEE AND HARDWARE REMOVAL;  Surgeon: Jodi Geralds, MD;  Location: [facility];  Service: Orthopedics;  Laterality: Left;   KNEE ARTHROSCOPY Left    UPPER GI ENDOSCOPY     WISDOM TOOTH EXTRACTION      REVIEW OF SYSTEMS:   Review of Systems  Constitutional: Positive for fatigue up and down. Negative for appetite change, chills, fever and unexpected weight change.  HENT: Negative for mouth sores, nosebleeds, sore throat and trouble swallowing.   Eyes: Negative for eye problems and icterus.  Respiratory: Negative for cough, hemoptysis, shortness of breath and  wheezing.   Cardiovascular: Negative for chest pain and leg swelling.  Gastrointestinal: Negative for abdominal pain, constipation, diarrhea, nausea and vomiting.  Genitourinary: Negative for bladder incontinence, difficulty urinating, dysuria, frequency and hematuria.   Musculoskeletal: Negative for back pain, gait problem, neck pain and neck stiffness.  Skin: Negative for itching and rash.  Neurological: Negative for dizziness, extremity weakness, gait problem, headaches, light-headedness and seizures.  Hematological: Negative for adenopathy. Does not bruise/bleed easily.  Psychiatric/Behavioral: Negative for confusion, depression and sleep disturbance. The patient is not nervous/anxious.     PHYSICAL EXAMINATION:  Blood pressure 121/75, pulse 81, temperature 97.8 F (36.6 C), temperature source Temporal, resp. rate 18, weight 217 lb 12.8 oz (98.8 kg), SpO2 100%.  ECOG PERFORMANCE STATUS: 1  Physical Exam  Constitutional: Oriented to person, place, and time and well-developed, well-nourished, and in no distress. HENT:  Head: Normocephalic and atraumatic.  Mouth/Throat: Oropharynx is clear and moist. No oropharyngeal exudate.  Eyes: Conjunctivae are normal. Right eye exhibits no discharge. Left eye exhibits no discharge. No scleral icterus.  Neck: Normal range of motion. Neck supple.  Cardiovascular: Normal rate, regular rhythm, normal heart sounds and intact distal pulses.   Pulmonary/Chest: Effort normal and breath sounds normal. No respiratory distress. No wheezes. No rales.  Abdominal: Soft. Bowel sounds are normal. Exhibits no distension and no mass. There is no tenderness.  Musculoskeletal: Normal range of motion. Exhibits no edema.  Lymphadenopathy:    No cervical adenopathy.  Neurological: Alert and oriented to person, place, and time. Exhibits normal muscle tone. Gait normal. Coordination normal.  Skin: Skin is warm and dry. No rash noted. Not diaphoretic. No erythema. No  pallor.  Psychiatric: Mood, memory and judgment normal.  Vitals reviewed.  LABORATORY DATA: Lab Results  Component Value Date   WBC 6.3 11/12/2023   HGB 11.2 (L) 11/12/2023   HCT 33.7 (L) 11/12/2023   MCV 78.9 (L) 11/12/2023   PLT 337 11/12/2023      Chemistry      Component Value Date/Time   NA 136 07/07/2023 0820   K 3.9 07/07/2023 0820   CL 103 07/07/2023 0820   CO2 26 07/07/2023 0820   BUN 11 07/07/2023 0820   CREATININE 0.73 07/07/2023 0820   CREATININE 0.67 11/29/2022 1005      Component Value Date/Time   CALCIUM 8.9 07/07/2023 0820   ALKPHOS 71 07/07/2023 0820   AST 13 (L) 07/07/2023 0820   ALT 15 07/07/2023 0820   BILITOT 0.3 07/07/2023 0820       RADIOGRAPHIC STUDIES:  No results found.   ASSESSMENT/PLAN:  This is a very pleasant 27 year old African-American female with iron deficiency anemia secondary to menorrhagia.  She was treated in the past with IV iron infusions with Venofer in 2022.  She is currently on over-the-counter iron supplements since March 2024.  She had repeat lab work today showing hemoglobin of 11.2 and her MCV  78.9. I am waiting for her iron results to come back.   She continue with her oral iron supplement with vitamin C daily or every other day.  If her labs continue to show significant iron deficiency, then we will arrange for IV iron infusions. I will send her a mychart message with the results.   We will see her back for follow-up visit with repeat labs in 3 months.  She will follow-up with her gynecologist regarding her menorrhagia. She is thinking out OCPs at this time.   The patient was advised to call immediately if she has any concerning symptoms in the interval. The patient voices understanding of current disease status and treatment options and is in agreement with the current care plan. All questions were answered. The patient knows to call the clinic with any problems, questions or concerns. We can certainly see the  patient much sooner if necessary   Orders Placed This Encounter  Procedures   CBC with Differential (Cancer Center Only)    Standing Status:   Future    Expected Date:   02/10/2024    Expiration Date:   11/11/2024   Ferritin    Standing Status:   Future    Expected Date:   02/10/2024    Expiration Date:   11/11/2024   Iron and Iron Binding Capacity ([facility] only)    Standing Status:   Future    Expected Date:   02/10/2024    Expiration Date:   11/11/2024     The total time spent in the appointment was 20-29 minutes  Mehran Guderian L Brinly Maietta, PA-C 11/12/23

## 2023-11-09 ENCOUNTER — Other Ambulatory Visit (HOSPITAL_COMMUNITY): Payer: Self-pay

## 2023-11-09 ENCOUNTER — Other Ambulatory Visit: Payer: Self-pay | Admitting: Pharmacist

## 2023-11-09 ENCOUNTER — Ambulatory Visit: Payer: Self-pay | Attending: Internal Medicine | Admitting: Pharmacist

## 2023-11-09 ENCOUNTER — Encounter (HOSPITAL_COMMUNITY): Payer: Self-pay

## 2023-11-09 ENCOUNTER — Other Ambulatory Visit: Payer: Self-pay

## 2023-11-09 DIAGNOSIS — Z7189 Other specified counseling: Secondary | ICD-10-CM

## 2023-11-09 MED ORDER — DUPIXENT 300 MG/2ML ~~LOC~~ SOSY
PREFILLED_SYRINGE | SUBCUTANEOUS | 11 refills | Status: AC
  Filled 2023-11-09 – 2024-07-17 (×2): qty 4, fill #0
  Filled 2024-07-17 – 2024-07-18 (×3): qty 4, 28d supply, fill #0
  Filled 2024-08-19: qty 4, 28d supply, fill #1
  Filled 2024-09-24 (×4): qty 4, 28d supply, fill #2

## 2023-11-09 NOTE — Progress Notes (Signed)
   S: Patient presents for review of their specialty medication therapy.  Patient is currently taking Dupixent for atopic dermatitis. Patient is managed by Dr. Lenon Oms for this.   Adherence:   Efficacy: the medication works really well for her!  Dosing: 300 mg q14 days  Dose adjustments: Renal: no dose adjustments (has not been studied) Hepatic: no dose adjustments (has not been studied)  Drug-drug interactions: none identified  Monitoring: S/sx of infection: none S/sx of hypersensitivity: none S/sx of ocular effects: none S/sx of eosinophilia/vasculitis: none  O:     Lab Results  Component Value Date   WBC 7.5 07/07/2023   HGB 12.3 07/07/2023   HCT 37.5 07/07/2023   MCV 81.7 07/07/2023   PLT 341 07/07/2023      Chemistry      Component Value Date/Time   NA 136 07/07/2023 0820   K 3.9 07/07/2023 0820   CL 103 07/07/2023 0820   CO2 26 07/07/2023 0820   BUN 11 07/07/2023 0820   CREATININE 0.73 07/07/2023 0820   CREATININE 0.67 11/29/2022 1005      Component Value Date/Time   CALCIUM 8.9 07/07/2023 0820   ALKPHOS 71 07/07/2023 0820   AST 13 (L) 07/07/2023 0820   ALT 15 07/07/2023 0820   BILITOT 0.3 07/07/2023 0820       A/P: 1. Medication review: Patient currently on Dupixent for atopic dermatitis. Reviewed the medication with the patient, including the following: Dupixent is a monoclonal antibody used for the treatment of asthma or atopic dermatitis. Patient educated on purpose, proper use and potential adverse effects of Dupixent. Possible adverse effects include increased risk of infection, ocular effects, vasculitis/eosinophilia, and hypersensitivity reactions. Administer as a SubQ injection and rotate sites. Allow the medication to reach room temp prior to administration (45 mins for 300 mg syringe or 30 min for 200 mg syringe). Do not shake. Discard any unused portion. No recommendations for any changes.  Butch Penny, PharmD, Patsy Baltimore,  CPP Clinical Pharmacist [facility] & [facility] [phone]

## 2023-11-09 NOTE — Progress Notes (Signed)
Please see OV from 11/09/2023 for future refills.   Butch Penny, PharmD, Patsy [place], CPP Clinical Pharmacist [facility] & [facility], [facility] [phone]

## 2023-11-12 ENCOUNTER — Encounter: Payer: Self-pay | Admitting: Physician Assistant

## 2023-11-12 ENCOUNTER — Inpatient Hospital Stay (HOSPITAL_BASED_OUTPATIENT_CLINIC_OR_DEPARTMENT_OTHER): Payer: 59 | Admitting: Physician Assistant

## 2023-11-12 ENCOUNTER — Other Ambulatory Visit: Payer: Self-pay | Admitting: Physician Assistant

## 2023-11-12 ENCOUNTER — Inpatient Hospital Stay: Payer: 59 | Attending: Internal Medicine

## 2023-11-12 VITALS — BP 121/75 | HR 81 | Temp 97.8°F | Resp 18 | Wt 217.8 lb

## 2023-11-12 DIAGNOSIS — D500 Iron deficiency anemia secondary to blood loss (chronic): Secondary | ICD-10-CM | POA: Insufficient documentation

## 2023-11-12 DIAGNOSIS — D509 Iron deficiency anemia, unspecified: Secondary | ICD-10-CM | POA: Insufficient documentation

## 2023-11-12 DIAGNOSIS — N920 Excessive and frequent menstruation with regular cycle: Secondary | ICD-10-CM | POA: Insufficient documentation

## 2023-11-12 LAB — CBC WITH DIFFERENTIAL (CANCER CENTER ONLY)
Abs Immature Granulocytes: 0.01 10*3/uL (ref 0.00–0.07)
Basophils Absolute: 0.1 10*3/uL (ref 0.0–0.1)
Basophils Relative: 1 %
Eosinophils Absolute: 0.4 10*3/uL (ref 0.0–0.5)
Eosinophils Relative: 7 %
HCT: 33.7 % — ABNORMAL LOW (ref 36.0–46.0)
Hemoglobin: 11.2 g/dL — ABNORMAL LOW (ref 12.0–15.0)
Immature Granulocytes: 0 %
Lymphocytes Relative: 34 %
Lymphs Abs: 2.1 10*3/uL (ref 0.7–4.0)
MCH: 26.2 pg (ref 26.0–34.0)
MCHC: 33.2 g/dL (ref 30.0–36.0)
MCV: 78.9 fL — ABNORMAL LOW (ref 80.0–100.0)
Monocytes Absolute: 0.6 10*3/uL (ref 0.1–1.0)
Monocytes Relative: 10 %
Neutro Abs: 3.1 10*3/uL (ref 1.7–7.7)
Neutrophils Relative %: 48 %
Platelet Count: 337 10*3/uL (ref 150–400)
RBC: 4.27 MIL/uL (ref 3.87–5.11)
RDW: 12.6 % (ref 11.5–15.5)
WBC Count: 6.3 10*3/uL (ref 4.0–10.5)
nRBC: 0 % (ref 0.0–0.2)

## 2023-11-12 LAB — IRON AND IRON BINDING CAPACITY (CC-WL,HP ONLY)
Iron: 35 ug/dL (ref 28–170)
Saturation Ratios: 8 % — ABNORMAL LOW (ref 10.4–31.8)
TIBC: 455 ug/dL — ABNORMAL HIGH (ref 250–450)
UIBC: 420 ug/dL (ref 148–442)

## 2023-11-12 LAB — FERRITIN: Ferritin: 7 ng/mL — ABNORMAL LOW (ref 11–307)

## 2023-11-13 ENCOUNTER — Telehealth: Payer: Self-pay

## 2023-11-13 ENCOUNTER — Other Ambulatory Visit: Payer: Self-pay

## 2023-11-13 NOTE — Telephone Encounter (Signed)
Cassandra, patient will be scheduled as soon as possible.  Auth Submission: NO AUTH NEEDED Site of care: Site of care: [facility] Payer: [employer] Medication & CPT/J Code(s) submitted: Venofer (Iron Sucrose) J1756 Route of submission (phone, fax, portal):  Phone # Fax # Auth type: Buy/Bill PB Units/visits requested: 200mg  x 5 doses Reference number:  Approval from: 11/13/23 to 11/27/23

## 2023-11-15 ENCOUNTER — Other Ambulatory Visit: Payer: Self-pay

## 2023-11-19 ENCOUNTER — Ambulatory Visit: Payer: 59 | Attending: Advanced Practice Midwife | Admitting: Physical Therapy

## 2023-11-19 DIAGNOSIS — R293 Abnormal posture: Secondary | ICD-10-CM | POA: Insufficient documentation

## 2023-11-19 DIAGNOSIS — M62838 Other muscle spasm: Secondary | ICD-10-CM | POA: Insufficient documentation

## 2023-11-19 DIAGNOSIS — R279 Unspecified lack of coordination: Secondary | ICD-10-CM | POA: Diagnosis not present
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
## 2023-11-26 ENCOUNTER — Other Ambulatory Visit (HOSPITAL_COMMUNITY): Payer: Self-pay

## 2023-11-29 ENCOUNTER — Ambulatory Visit: Payer: 59 | Attending: Advanced Practice Midwife | Admitting: Physical Therapy

## 2023-11-29 DIAGNOSIS — R293 Abnormal posture: Secondary | ICD-10-CM | POA: Insufficient documentation

## 2023-11-29 DIAGNOSIS — M62838 Other muscle spasm: Secondary | ICD-10-CM | POA: Diagnosis not present

## 2023-11-29 DIAGNOSIS — M6281 Muscle weakness (generalized): Secondary | ICD-10-CM | POA: Insufficient documentation

## 2023-11-29 DIAGNOSIS — R279 Unspecified lack of coordination: Secondary | ICD-10-CM | POA: Diagnosis not present
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
## 2023-11-30 DIAGNOSIS — F339 Major depressive disorder, recurrent, unspecified: Secondary | ICD-10-CM | POA: Diagnosis not present

## 2023-11-30 DIAGNOSIS — F411 Generalized anxiety disorder: Secondary | ICD-10-CM | POA: Diagnosis not present

## 2023-12-03 ENCOUNTER — Other Ambulatory Visit (HOSPITAL_BASED_OUTPATIENT_CLINIC_OR_DEPARTMENT_OTHER): Payer: Self-pay

## 2023-12-03 ENCOUNTER — Other Ambulatory Visit (HOSPITAL_COMMUNITY): Payer: Self-pay

## 2023-12-03 ENCOUNTER — Encounter (HOSPITAL_BASED_OUTPATIENT_CLINIC_OR_DEPARTMENT_OTHER): Payer: Self-pay | Admitting: *Deleted

## 2023-12-03 ENCOUNTER — Other Ambulatory Visit (HOSPITAL_BASED_OUTPATIENT_CLINIC_OR_DEPARTMENT_OTHER): Payer: Self-pay | Admitting: Obstetrics & Gynecology

## 2023-12-03 DIAGNOSIS — F419 Anxiety disorder, unspecified: Secondary | ICD-10-CM

## 2023-12-03 MED ORDER — DIAZEPAM 5 MG PO TABS
ORAL_TABLET | ORAL | 0 refills | Status: DC
  Filled 2023-12-03: qty 2, 1d supply, fill #0

## 2023-12-03 MED ORDER — DIAZEPAM 5 MG PO TABS: ORAL_TABLET | ORAL | 0 refills | Status: DC

## 2023-12-04 ENCOUNTER — Other Ambulatory Visit (HOSPITAL_COMMUNITY)
Admission: RE | Admit: 2023-12-04 | Discharge: 2023-12-04 | Disposition: A | Discharge status: Home / Self Care | Payer: 59 | Source: Ambulatory Visit | Attending: Obstetrics & Gynecology | Admitting: Obstetrics & Gynecology

## 2023-12-04 ENCOUNTER — Ambulatory Visit (HOSPITAL_BASED_OUTPATIENT_CLINIC_OR_DEPARTMENT_OTHER): Payer: 59 | Admitting: Obstetrics & Gynecology

## 2023-12-04 ENCOUNTER — Encounter (HOSPITAL_BASED_OUTPATIENT_CLINIC_OR_DEPARTMENT_OTHER): Payer: Self-pay | Admitting: Obstetrics & Gynecology

## 2023-12-04 ENCOUNTER — Encounter: Payer: Self-pay | Admitting: Physician Assistant

## 2023-12-04 VITALS — BP 129/82 | HR 78 | Ht 66.0 in | Wt 214.0 lb

## 2023-12-04 DIAGNOSIS — Z124 Encounter for screening for malignant neoplasm of cervix: Secondary | ICD-10-CM | POA: Insufficient documentation

## 2023-12-04 NOTE — Progress Notes (Signed)
 GYNECOLOGY  VISIT  CC:   Pap smear  HPI: 28 y.o. G0P0000 Single Black or African American female here for Pap smear.  She has vaginismus and has been doing pelvic PT.  She does not use tampons.  Has been using dilators.  Never had vaginal penetration.    Attempt was made to obtain pap with [facility]EX in July with [facility].  Here for this again.     Past Medical History:  Diagnosis Date   Anemia    iron   Anxiety    Asthma    Eczema    GERD (gastroesophageal reflux disease)    Palpitations     MEDS:   Current Outpatient Medications on File Prior to Visit  Medication Sig Dispense Refill   Ascorbic Acid (VITAMIN C GUMMIE PO) Take 2 tablets by mouth daily.     cetirizine (ZYRTEC) 10 MG tablet Take 10 mg by mouth daily as needed for allergies.     ferrous sulfate 325 (65 FE) MG tablet Take 325 mg by mouth daily with breakfast.     sertraline  (ZOLOFT ) 100 MG tablet Take 1 tablet (100 mg total) by mouth at bedtime. For mood 30 tablet 0   diazepam  (VALIUM ) 5 MG tablet Take 1 tablet 90 minutes before procedure.  May repeat once if needed after 60 minutes. (Patient not taking: Reported on 12/04/2023) 2 tablet 0   dupilumab  (DUPIXENT ) 300 MG/2ML prefilled syringe Inject 1 syringe into the skin every other week. (Patient not taking: Reported on 12/04/2023) 4 mL 11   EPINEPHrine  0.3 mg/0.3 mL IJ SOAJ injection      hydrOXYzine  (ATARAX /VISTARIL ) 25 MG tablet Take 1 tablet (25 mg total) by mouth every 8 (eight) hours as needed for anxiety or itching. (Patient not taking: Reported on 11/29/2022) 60 tablet 1   PROAIR  RESPICLICK 108 (90 Base) MCG/ACT AEPB Inhale 2 puffs into the lungs every 6 (six) hours as needed (SOB, wheezing). (Patient not taking: Reported on 12/04/2023)     triamcinolone ointment (KENALOG) 0.1 % Apply 1 application topically 2 (two) times daily as needed (eczema). (Patient not taking: Reported on 12/04/2023)     No current facility-administered medications on file prior to visit.     ALLERGIES: Chocolate flavoring agent (non-screening), Cocoa, Latex, Other, Peanut oil, Shellfish allergy, Apple, Apple fruit extract, Bee pollen, Fish allergy, Fish-derived products, Peanut-containing drug products, Pollen extract, Short ragweed pollen ext, and Banana  SH:  single, non smoker  Review of Systems  Constitutional: Negative.   Genitourinary: Negative.     PHYSICAL EXAMINATION:    BP 129/82 (BP Location: Left Arm, Patient Position: Sitting, Cuff Size: Normal)   Pulse 78   Ht 5' 6 (1.676 m)   Wt 214 lb (97.1 kg)   LMP 11/26/2023   BMI 34.54 kg/m     General appearance: alert, cooperative and appears stated age Lymph:  no inguinal LAD noted  Pelvic: External genitalia:  no lesions              Urethra:  normal appearing urethra with no masses, tenderness or lesions              Bartholins and Skenes: normal                 Vagina: normal mucosa without prolapse or lesions              Cervix: no lesions              Bimanual Exam:  deferred  Chaperone, Bascom Kotyk, CMA, was present for exam.  Assessment/Plan: 1. Cervical cancer screening (Primary) - Cytology - PAP( [facility])

## 2023-12-07 LAB — CYTOLOGY - PAP
Diagnosis: NEGATIVE
Diagnosis: REACTIVE

## 2023-12-12 ENCOUNTER — Ambulatory Visit: Payer: 59 | Admitting: Physical Therapy

## 2023-12-12 DIAGNOSIS — M6281 Muscle weakness (generalized): Secondary | ICD-10-CM | POA: Diagnosis not present

## 2023-12-12 DIAGNOSIS — R293 Abnormal posture: Secondary | ICD-10-CM | POA: Diagnosis not present

## 2023-12-12 DIAGNOSIS — M62838 Other muscle spasm: Secondary | ICD-10-CM

## 2023-12-12 DIAGNOSIS — R279 Unspecified lack of coordination: Secondary | ICD-10-CM | POA: Diagnosis not present
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
## 2023-12-14 ENCOUNTER — Encounter (HOSPITAL_BASED_OUTPATIENT_CLINIC_OR_DEPARTMENT_OTHER): Payer: Self-pay | Admitting: *Deleted

## 2023-12-17 ENCOUNTER — Other Ambulatory Visit (HOSPITAL_COMMUNITY): Payer: Self-pay

## 2023-12-17 ENCOUNTER — Other Ambulatory Visit (HOSPITAL_BASED_OUTPATIENT_CLINIC_OR_DEPARTMENT_OTHER): Payer: Self-pay

## 2023-12-17 DIAGNOSIS — B9689 Other specified bacterial agents as the cause of diseases classified elsewhere: Secondary | ICD-10-CM

## 2023-12-17 MED ORDER — METRONIDAZOLE 500 MG PO TABS
500.0000 mg | ORAL_TABLET | Freq: Two times a day (BID) | ORAL | 0 refills | Status: DC
  Filled 2023-12-17: qty 14, 7d supply, fill #0

## 2023-12-27 ENCOUNTER — Other Ambulatory Visit (HOSPITAL_COMMUNITY): Payer: Self-pay

## 2024-01-02 ENCOUNTER — Encounter: Payer: Self-pay | Admitting: Physical Therapy

## 2024-01-04 ENCOUNTER — Other Ambulatory Visit (HOSPITAL_COMMUNITY): Payer: Self-pay

## 2024-01-04 MED ORDER — AMOXICILLIN 500 MG PO CAPS
500.0000 mg | ORAL_CAPSULE | Freq: Three times a day (TID) | ORAL | 0 refills | Status: DC
  Filled 2024-01-04: qty 21, 7d supply, fill #0

## 2024-01-14 ENCOUNTER — Other Ambulatory Visit (HOSPITAL_COMMUNITY): Payer: Self-pay

## 2024-02-02 NOTE — Progress Notes (Deleted)
 [facility] OFFICE PROGRESS NOTE  System, Provider Not In No address on file  DIAGNOSIS:  iron deficiency anemia secondary to menorrhagia.   PRIOR THERAPY: 1) IV iron with venofer PRN, most recent dose ~2022 in [place]   CURRENT THERAPY: Oral iron supplement with OTC ferrous sulfate every other day with vitamin C   INTERVAL HISTORY: Cheyenne Schwartz 28 y.o. female returns fto the clinic today for a follow-up visit.  She was last seen by myself in December 2024 for a follow up.   I did offer IV iron infusions at her last appointment via mychart message which ***the patient declined ***they tried to reach her.   The patient is followed for her history of iron deficiency anemia.  She has not had any iron infusions in several years.  She is compliant with her oral iron supplement p.o.  every other day.  Since last being seen she denies any major changes in her health.      Overall, she is feeling "***".  When she has worsening iron deficiency, she reports craving ice chips.  She states she is not*** craving any ice chips at this time.  Her energy is up-and-down*** but overall fairly good*** at this time.  She denies any shortness of breath.  Denies any other abnormal bleeding or bruising.  She still continues to have heavy menstrual cycles.  Her menstrual periods last 7*** days with associated clots.  She feels like her menstrual cycles have been heavier recently***.  She saw her PCP and GYN who recommended birth control but she opted to think about her options****She saw them for a follow up visit on 12/04/23 for a pap smear. She is trying to increase her iron rich food and spinach. Otherwise she denies any other abnormal bleeding or bruising.  Denies any lightheadedness or syncope.  She is here for evaluation and repeat blood work.   MEDICAL HISTORY: Past Medical History:  Diagnosis Date   Anemia    iron   Anxiety    Asthma    Eczema    GERD (gastroesophageal reflux disease)     Palpitations     ALLERGIES:  is allergic to chocolate flavoring agent (non-screening), cocoa, latex, other, peanut oil, shellfish allergy, apple, apple fruit extract, bee pollen, fish allergy, fish-derived products, peanut-containing drug products, pollen extract, short ragweed pollen ext, and banana.  MEDICATIONS:  Current Outpatient Medications  Medication Sig Dispense Refill   amoxicillin (AMOXIL) 500 MG capsule Take 1 capsule (500 mg total) by mouth 3 (three) times daily until gone. 21 capsule 0   Ascorbic Acid (VITAMIN C GUMMIE PO) Take 2 tablets by mouth daily.     cetirizine (ZYRTEC) 10 MG tablet Take 10 mg by mouth daily as needed for allergies.     diazepam (VALIUM) 5 MG tablet Take 1 tablet 90 minutes before procedure.  May repeat once if needed after 60 minutes. (Patient not taking: Reported on 12/04/2023) 2 tablet 0   dupilumab (DUPIXENT) 300 MG/2ML prefilled syringe Inject 1 syringe into the skin every other week. (Patient not taking: Reported on 12/04/2023) 4 mL 11   EPINEPHrine 0.3 mg/0.3 mL IJ SOAJ injection      ferrous sulfate 325 (65 FE) MG tablet Take 325 mg by mouth daily with breakfast.     hydrOXYzine (ATARAX/VISTARIL) 25 MG tablet Take 1 tablet (25 mg total) by mouth every 8 (eight) hours as needed for anxiety or itching. (Patient not taking: Reported on 11/29/2022) 60 tablet 1  metroNIDAZOLE (FLAGYL) 500 MG tablet Take 1 tablet (500 mg total) by mouth 2 (two) times daily. 14 tablet 0   PROAIR RESPICLICK 108 (90 Base) MCG/ACT AEPB Inhale 2 puffs into the lungs every 6 (six) hours as needed (SOB, wheezing). (Patient not taking: Reported on 12/04/2023)     sertraline (ZOLOFT) 100 MG tablet Take 1 tablet (100 mg total) by mouth at bedtime. For mood 30 tablet 0   triamcinolone ointment (KENALOG) 0.1 % Apply 1 application topically 2 (two) times daily as needed (eczema). (Patient not taking: Reported on 12/04/2023)     No current facility-administered medications for this visit.     SURGICAL HISTORY:  Past Surgical History:  Procedure Laterality Date   IRRIGATION AND DEBRIDEMENT KNEE Left 11/14/2021   Procedure: IRRIGATION AND DEBRIDEMENT KNEE AND HARDWARE REMOVAL;  Surgeon: Jodi Geralds, MD;  Location: [facility];  Service: Orthopedics;  Laterality: Left;   KNEE ARTHROSCOPY Left    UPPER GI ENDOSCOPY     WISDOM TOOTH EXTRACTION      REVIEW OF SYSTEMS:   Review of Systems  Constitutional: Negative for appetite change, chills, fatigue, fever and unexpected weight change.  HENT:   Negative for mouth sores, nosebleeds, sore throat and trouble swallowing.   Eyes: Negative for eye problems and icterus.  Respiratory: Negative for cough, hemoptysis, shortness of breath and wheezing.   Cardiovascular: Negative for chest pain and leg swelling.  Gastrointestinal: Negative for abdominal pain, constipation, diarrhea, nausea and vomiting.  Genitourinary: Negative for bladder incontinence, difficulty urinating, dysuria, frequency and hematuria.   Musculoskeletal: Negative for back pain, gait problem, neck pain and neck stiffness.  Skin: Negative for itching and rash.  Neurological: Negative for dizziness, extremity weakness, gait problem, headaches, light-headedness and seizures.  Hematological: Negative for adenopathy. Does not bruise/bleed easily.  Psychiatric/Behavioral: Negative for confusion, depression and sleep disturbance. The patient is not nervous/anxious.     PHYSICAL EXAMINATION:  There were no vitals taken for this visit.  ECOG PERFORMANCE STATUS: {CHL ONC ECOG [id number]  Physical Exam  Constitutional: Oriented to person, place, and time and well-developed, well-nourished, and in no distress. No distress.  HENT:  Head: Normocephalic and atraumatic.  Mouth/Throat: Oropharynx is clear and moist. No oropharyngeal exudate.  Eyes: Conjunctivae are normal. Right eye exhibits no discharge. Left eye exhibits no discharge. No scleral icterus.  Neck: Normal  range of motion. Neck supple.  Cardiovascular: Normal rate, regular rhythm, normal heart sounds and intact distal pulses.   Pulmonary/Chest: Effort normal and breath sounds normal. No respiratory distress. No wheezes. No rales.  Abdominal: Soft. Bowel sounds are normal. Exhibits no distension and no mass. There is no tenderness.  Musculoskeletal: Normal range of motion. Exhibits no edema.  Lymphadenopathy:    No cervical adenopathy.  Neurological: Alert and oriented to person, place, and time. Exhibits normal muscle tone. Gait normal. Coordination normal.  Skin: Skin is warm and dry. No rash noted. Not diaphoretic. No erythema. No pallor.  Psychiatric: Mood, memory and judgment normal.  Vitals reviewed.  LABORATORY DATA: Lab Results  Component Value Date   WBC 6.3 11/12/2023   HGB 11.2 (L) 11/12/2023   HCT 33.7 (L) 11/12/2023   MCV 78.9 (L) 11/12/2023   PLT 337 11/12/2023      Chemistry      Component Value Date/Time   NA 136 07/07/2023 0820   K 3.9 07/07/2023 0820   CL 103 07/07/2023 0820   CO2 26 07/07/2023 0820   BUN 11 07/07/2023 0820  CREATININE 0.73 07/07/2023 0820   CREATININE 0.67 11/29/2022 1005      Component Value Date/Time   CALCIUM 8.9 07/07/2023 0820   ALKPHOS 71 07/07/2023 0820   AST 13 (L) 07/07/2023 0820   ALT 15 07/07/2023 0820   BILITOT 0.3 07/07/2023 0820       RADIOGRAPHIC STUDIES:  No results found.   ASSESSMENT/PLAN:  This is a very pleasant 28 year old African-American female with iron deficiency anemia secondary to menorrhagia.  She was treated in the past with IV iron infusions with Venofer in 2022.  She is currently on over-the-counter iron supplements since March 2024.  At her last appointment I recommended IV iron but she ***   She had repeat lab work today showing hemoglobin of *** and her MCV ***. I am waiting for her iron results to come back.    She continue with her oral iron supplement with vitamin C daily or every other day.   If her labs continue to show significant iron deficiency, then we will arrange for IV iron infusions. I will send her a mychart message with the results.    We will see her back for follow-up visit with repeat labs in 3 months.   She will follow-up with her gynecologist regarding her menorrhagia. She is thinking out OCPs at this time. ***  The patient was advised to call immediately if she has any concerning symptoms in the interval. The patient voices understanding of current disease status and treatment options and is in agreement with the current care plan. All questions were answered. The patient knows to call the clinic with any problems, questions or concerns. We can certainly see the patient much sooner if necessary       No orders of the defined types were placed in this encounter.    I spent {CHL ONC TIME VISIT - GNFAO:[id number]} counseling the patient face to face. The total time spent in the appointment was {CHL ONC TIME VISIT - NGEXB:[id number]}.  Siddhant Hashemi L Serenitie Vinton, PA-C 02/02/24

## 2024-02-07 ENCOUNTER — Telehealth: Payer: Self-pay | Admitting: Physician Assistant

## 2024-02-07 NOTE — Telephone Encounter (Signed)
 Rescheduled appointments per patient request. The patient is aware of the changes made.

## 2024-02-11 ENCOUNTER — Inpatient Hospital Stay: Payer: 59

## 2024-02-11 ENCOUNTER — Inpatient Hospital Stay: Payer: 59 | Admitting: Physician Assistant

## 2024-03-10 NOTE — Progress Notes (Deleted)
 [facility] OFFICE PROGRESS NOTE  System, Provider Not In No address on file  DIAGNOSIS:  iron deficiency anemia secondary to menorrhagia.   PRIOR THERAPY: 1) IV iron with venofer PRN, most recent dose ~2022 in [place]   CURRENT THERAPY: Oral iron supplement with OTC ferrous sulfate every other day with vitamin C   INTERVAL HISTORY: Cheyenne Schwartz 28 y.o. female returns fto the clinic today for a follow-up visit.  She was last seen by myself in December 2024 for a follow up.   I did offer IV iron infusions at her last appointment via mychart message which ***the patient declined ***they tried to reach her.   The patient is followed for her history of iron deficiency anemia.  She has not had any iron infusions in several years.  She is compliant with her oral iron supplement p.o.  every other day.  Since last being seen she denies any major changes in her health.      Overall, she is feeling "***".  When she has worsening iron deficiency, she reports craving ice chips.  She states she is not*** craving any ice chips at this time.  Her energy is up-and-down*** but overall fairly good*** at this time.  She denies any shortness of breath.  Denies any other abnormal bleeding or bruising.  She still continues to have heavy menstrual cycles.  Her menstrual periods last 7*** days with associated clots.  She feels like her menstrual cycles have been heavier recently***.  She saw her PCP and GYN who recommended birth control but she opted to think about her options****She saw them for a follow up visit on 12/04/23 for a pap smear. She is trying to increase her iron rich food and spinach. Otherwise she denies any other abnormal bleeding or bruising.  Denies any lightheadedness or syncope.  She is here for evaluation and repeat blood work.   MEDICAL HISTORY: Past Medical History:  Diagnosis Date   Anemia    iron   Anxiety    Asthma    Eczema    GERD (gastroesophageal reflux disease)     Palpitations     ALLERGIES:  is allergic to chocolate flavoring agent (non-screening), cocoa, latex, other, peanut oil, shellfish allergy, apple, apple fruit extract, bee pollen, fish allergy, fish-derived products, peanut-containing drug products, pollen extract, short ragweed pollen ext, and banana.  MEDICATIONS:  Current Outpatient Medications  Medication Sig Dispense Refill   amoxicillin (AMOXIL) 500 MG capsule Take 1 capsule (500 mg total) by mouth 3 (three) times daily until gone. 21 capsule 0   Ascorbic Acid (VITAMIN C GUMMIE PO) Take 2 tablets by mouth daily.     cetirizine (ZYRTEC) 10 MG tablet Take 10 mg by mouth daily as needed for allergies.     diazepam (VALIUM) 5 MG tablet Take 1 tablet 90 minutes before procedure.  May repeat once if needed after 60 minutes. (Patient not taking: Reported on 12/04/2023) 2 tablet 0   dupilumab (DUPIXENT) 300 MG/2ML prefilled syringe Inject 1 syringe into the skin every other week. (Patient not taking: Reported on 12/04/2023) 4 mL 11   EPINEPHrine 0.3 mg/0.3 mL IJ SOAJ injection      ferrous sulfate 325 (65 FE) MG tablet Take 325 mg by mouth daily with breakfast.     hydrOXYzine (ATARAX/VISTARIL) 25 MG tablet Take 1 tablet (25 mg total) by mouth every 8 (eight) hours as needed for anxiety or itching. (Patient not taking: Reported on 11/29/2022) 60 tablet 1  metroNIDAZOLE (FLAGYL) 500 MG tablet Take 1 tablet (500 mg total) by mouth 2 (two) times daily. 14 tablet 0   PROAIR RESPICLICK 108 (90 Base) MCG/ACT AEPB Inhale 2 puffs into the lungs every 6 (six) hours as needed (SOB, wheezing). (Patient not taking: Reported on 12/04/2023)     sertraline (ZOLOFT) 100 MG tablet Take 1 tablet (100 mg total) by mouth at bedtime. For mood 30 tablet 0   triamcinolone ointment (KENALOG) 0.1 % Apply 1 application topically 2 (two) times daily as needed (eczema). (Patient not taking: Reported on 12/04/2023)     No current facility-administered medications for this visit.     SURGICAL HISTORY:  Past Surgical History:  Procedure Laterality Date   IRRIGATION AND DEBRIDEMENT KNEE Left 11/14/2021   Procedure: IRRIGATION AND DEBRIDEMENT KNEE AND HARDWARE REMOVAL;  Surgeon: Jodi Geralds, MD;  Location: [facility];  Service: Orthopedics;  Laterality: Left;   KNEE ARTHROSCOPY Left    UPPER GI ENDOSCOPY     WISDOM TOOTH EXTRACTION      REVIEW OF SYSTEMS:   Review of Systems  Constitutional: Negative for appetite change, chills, fatigue, fever and unexpected weight change.  HENT:   Negative for mouth sores, nosebleeds, sore throat and trouble swallowing.   Eyes: Negative for eye problems and icterus.  Respiratory: Negative for cough, hemoptysis, shortness of breath and wheezing.   Cardiovascular: Negative for chest pain and leg swelling.  Gastrointestinal: Negative for abdominal pain, constipation, diarrhea, nausea and vomiting.  Genitourinary: Negative for bladder incontinence, difficulty urinating, dysuria, frequency and hematuria.   Musculoskeletal: Negative for back pain, gait problem, neck pain and neck stiffness.  Skin: Negative for itching and rash.  Neurological: Negative for dizziness, extremity weakness, gait problem, headaches, light-headedness and seizures.  Hematological: Negative for adenopathy. Does not bruise/bleed easily.  Psychiatric/Behavioral: Negative for confusion, depression and sleep disturbance. The patient is not nervous/anxious.     PHYSICAL EXAMINATION:  There were no vitals taken for this visit.  ECOG PERFORMANCE STATUS: {CHL ONC ECOG [id number]  Physical Exam  Constitutional: Oriented to person, place, and time and well-developed, well-nourished, and in no distress. No distress.  HENT:  Head: Normocephalic and atraumatic.  Mouth/Throat: Oropharynx is clear and moist. No oropharyngeal exudate.  Eyes: Conjunctivae are normal. Right eye exhibits no discharge. Left eye exhibits no discharge. No scleral icterus.  Neck: Normal  range of motion. Neck supple.  Cardiovascular: Normal rate, regular rhythm, normal heart sounds and intact distal pulses.   Pulmonary/Chest: Effort normal and breath sounds normal. No respiratory distress. No wheezes. No rales.  Abdominal: Soft. Bowel sounds are normal. Exhibits no distension and no mass. There is no tenderness.  Musculoskeletal: Normal range of motion. Exhibits no edema.  Lymphadenopathy:    No cervical adenopathy.  Neurological: Alert and oriented to person, place, and time. Exhibits normal muscle tone. Gait normal. Coordination normal.  Skin: Skin is warm and dry. No rash noted. Not diaphoretic. No erythema. No pallor.  Psychiatric: Mood, memory and judgment normal.  Vitals reviewed.  LABORATORY DATA: Lab Results  Component Value Date   WBC 6.3 11/12/2023   HGB 11.2 (L) 11/12/2023   HCT 33.7 (L) 11/12/2023   MCV 78.9 (L) 11/12/2023   PLT 337 11/12/2023      Chemistry      Component Value Date/Time   NA 136 07/07/2023 0820   K 3.9 07/07/2023 0820   CL 103 07/07/2023 0820   CO2 26 07/07/2023 0820   BUN 11 07/07/2023 0820  CREATININE 0.73 07/07/2023 0820   CREATININE 0.67 11/29/2022 1005      Component Value Date/Time   CALCIUM 8.9 07/07/2023 0820   ALKPHOS 71 07/07/2023 0820   AST 13 (L) 07/07/2023 0820   ALT 15 07/07/2023 0820   BILITOT 0.3 07/07/2023 0820       RADIOGRAPHIC STUDIES:  No results found.   ASSESSMENT/PLAN:  This is a very pleasant 28 year old African-American female with iron deficiency anemia secondary to menorrhagia.  She was treated in the past with IV iron infusions with Venofer in 2022.  She is currently on over-the-counter iron supplements since March 2024.  At her last appointment I recommended IV iron but she ***   She had repeat lab work today showing hemoglobin of *** and her MCV ***. I am waiting for her iron results to come back.    She continue with her oral iron supplement with vitamin C daily or every other day.   If her labs continue to show significant iron deficiency, then we will arrange for IV iron infusions. I will send her a mychart message with the results.    We will see her back for follow-up visit with repeat labs in 3 months.   She will follow-up with her gynecologist regarding her menorrhagia. She is thinking out OCPs at this time. ***  The patient was advised to call immediately if she has any concerning symptoms in the interval. The patient voices understanding of current disease status and treatment options and is in agreement with the current care plan. All questions were answered. The patient knows to call the clinic with any problems, questions or concerns. We can certainly see the patient much sooner if necessary       No orders of the defined types were placed in this encounter.    I spent {CHL ONC TIME VISIT - WUJWJ:[id number]} counseling the patient face to face. The total time spent in the appointment was {CHL ONC TIME VISIT - OZHYQ:[id number]}.  Quinzell Malcomb L Aldene Hendon, PA-C 03/10/24

## 2024-03-17 ENCOUNTER — Ambulatory Visit: Admitting: Physician Assistant

## 2024-03-17 ENCOUNTER — Other Ambulatory Visit

## 2024-03-18 ENCOUNTER — Other Ambulatory Visit (HOSPITAL_BASED_OUTPATIENT_CLINIC_OR_DEPARTMENT_OTHER): Payer: Self-pay

## 2024-03-18 ENCOUNTER — Ambulatory Visit: Admitting: Physician Assistant

## 2024-03-18 NOTE — Progress Notes (Deleted)
 New patient visit   Patient: Cheyenne Schwartz   DOB: October 28, 1996   27 y.o. Female  MRN: [id number] Visit Date: 03/18/2024  Today's healthcare provider: Trenton Frock, PA-C   No chief complaint on file.  Subjective    Cheyenne Schwartz is a 28 y.o. female who presents today as a new patient to establish care.    Hyroxyzine? Zoloft  100  Past Medical History:  Diagnosis Date   Anemia    iron   Anxiety    Asthma    Eczema    GERD (gastroesophageal reflux disease)    Palpitations    Past Surgical History:  Procedure Laterality Date   IRRIGATION AND DEBRIDEMENT KNEE Left 11/14/2021   Procedure: IRRIGATION AND DEBRIDEMENT KNEE AND HARDWARE REMOVAL;  Surgeon: Neil Balls, MD;  Location: [facility];  Service: Orthopedics;  Laterality: Left;   KNEE ARTHROSCOPY Left    UPPER GI ENDOSCOPY     WISDOM TOOTH EXTRACTION     Family Status  Relation Name Status   Father  Alive   Mother  Alive   Brother  Alive   Sister  Alive   Sister  Alive  No partnership data on file   Family History  Problem Relation Age of Onset   Hypertension Father    Arthritis Father    Diabetes Mother    Epilepsy Sister    Myasthenia gravis Sister    Social History   Socioeconomic History   Marital status: Single    Spouse name: Not on file   Number of children: Not on file   Years of education: Not on file   Highest education level: Bachelor's degree (e.g., BA, AB, BS)  Occupational History   Not on file  Tobacco Use   Smoking status: Never   Smokeless tobacco: Never  Vaping Use   Vaping status: Never Used  Substance and Sexual Activity   Alcohol use: Yes    Alcohol/week: 4.0 standard drinks of alcohol    Types: 4 Standard drinks or equivalent per week    Comment: occassional   Drug use: Never   Sexual activity: Yes    Birth control/protection: None  Other Topics Concern   Not on file  Social History Narrative   Not on file   Social Drivers of Health   Financial Resource Strain:  Low Risk  (03/17/2024)   Overall Financial Resource Strain (CARDIA)    Difficulty of Paying Living Expenses: Not very hard  Food Insecurity: No Food Insecurity (03/17/2024)   Hunger Vital Sign    Worried About Running Out of Food in the Last Year: Never true    Ran Out of Food in the Last Year: Never true  Transportation Needs: No Transportation Needs (03/17/2024)   PRAPARE - Administrator, Civil Service (Medical): No    Lack of Transportation (Non-Medical): No  Physical Activity: Insufficiently Active (03/17/2024)   Exercise Vital Sign    Days of Exercise per Week: 4 days    Minutes of Exercise per Session: 30 min  Stress: Stress Concern Present (03/17/2024)   [employer] of Occupational Health - Occupational Stress Questionnaire    Feeling of Stress : Rather much  Social Connections: Moderately Integrated (03/17/2024)   Social Connection and Isolation Panel [NHANES]    Frequency of Communication with Friends and Family: More than three times a week    Frequency of Social Gatherings with Friends and Family: Once a week    Attends Religious Services: More than 4  times per year    Active Member of Clubs or Organizations: Yes    Attends Banker Meetings: More than 4 times per year    Marital Status: Never married   Outpatient Medications Prior to Visit  Medication Sig   amoxicillin  (AMOXIL ) 500 MG capsule Take 1 capsule (500 mg total) by mouth 3 (three) times daily until gone.   Ascorbic Acid (VITAMIN C GUMMIE PO) Take 2 tablets by mouth daily.   cetirizine (ZYRTEC) 10 MG tablet Take 10 mg by mouth daily as needed for allergies.   diazepam  (VALIUM ) 5 MG tablet Take 1 tablet 90 minutes before procedure.  May repeat once if needed after 60 minutes. (Patient not taking: Reported on 12/04/2023)   dupilumab  (DUPIXENT ) 300 MG/2ML prefilled syringe Inject 1 syringe into the skin every other week. (Patient not taking: Reported on 12/04/2023)   EPINEPHrine 0.3 mg/0.3 mL IJ  SOAJ injection    ferrous sulfate 325 (65 FE) MG tablet Take 325 mg by mouth daily with breakfast.   hydrOXYzine  (ATARAX /VISTARIL ) 25 MG tablet Take 1 tablet (25 mg total) by mouth every 8 (eight) hours as needed for anxiety or itching. (Patient not taking: Reported on 11/29/2022)   metroNIDAZOLE  (FLAGYL ) 500 MG tablet Take 1 tablet (500 mg total) by mouth 2 (two) times daily.   PROAIR  RESPICLICK 108 (90 Base) MCG/ACT AEPB Inhale 2 puffs into the lungs every 6 (six) hours as needed (SOB, wheezing). (Patient not taking: Reported on 12/04/2023)   sertraline  (ZOLOFT ) 100 MG tablet Take 1 tablet (100 mg total) by mouth at bedtime. For mood   triamcinolone ointment (KENALOG) 0.1 % Apply 1 application topically 2 (two) times daily as needed (eczema). (Patient not taking: Reported on 12/04/2023)   No facility-administered medications prior to visit.   Allergies  Allergen Reactions   Chocolate Flavoring Agent (Non-Screening) Hives and Itching   Cocoa Hives   Latex Hives and Itching   Other Hives, Itching and Other (See Comments)    runny nose and Itchy throat   Peanut Oil Itching    Itchy Throat  Allergic to most nuts per patient  Itchy Throat Allergic to most nuts per patient     Itchy Throat   Shellfish Allergy Itching    Itchy throat   Apple Itching   Apple Fruit Extract Itching   Bee Pollen Other (See Comments) and Hives    Sneezing; runny nose   Fish Allergy Itching   Fish-Derived Products Other (See Comments) and Itching   Peanut-Containing Drug Products Other (See Comments)   Pollen Extract Other (See Comments)   Short Ragweed Pollen Ext Other (See Comments)   Banana Itching    Itchy Throat     Immunization History  Administered Date(s) Administered   HPV Quadrivalent 11/27/2014, 01/26/2015, 07/29/2015   Hepatitis A, Adult 09/27/2006, 11/28/2007   Influenza,inj,quad, With Preservative 09/10/2019   Influenza-Unspecified 08/27/2004, 09/27/2005, 09/27/2006, 08/28/2007, 09/27/2009,  08/27/2010, 08/28/2011, 07/28/2012, 08/27/2013, 09/27/2014, 08/27/2016, 08/27/2017, 08/27/2018   MMR 01/26/1999, 05/27/2002   Meningococcal B, Unspecified 01/25/2009, 06/27/2014, 01/26/2015, 04/28/2015, 10/28/2015   Pfizer(Comirnaty)Fall Seasonal Vaccine 12 years and older 11/29/2022   Tdap 11/28/2007, 06/27/2016   Varicella 09/27/1998, 09/27/2006    Health Maintenance  Topic Date Due   Pneumococcal Vaccine 68-74 Years old (1 of 2 - PCV) Never done   COVID-19 Vaccine (2 - 2024-25 season) 07/29/2023   INFLUENZA VACCINE  06/27/2024   DTaP/Tdap/Td (3 - Td or Tdap) 06/27/2026   Cervical Cancer Screening (Pap smear)  12/03/2026  HPV VACCINES  Completed   Hepatitis C Screening  Completed   HIV Screening  Completed   Meningococcal B Vaccine  Aged Out    Patient Care Team: System, Provider Not In as PCP - General  Review of Systems  {Insert previous labs (optional):23779} {See past labs  Heme  Chem  Endocrine  Serology  Results Review (optional):1}   Objective    There were no vitals taken for this visit. {Insert last BP/Wt (optional):23777}{See vitals history (optional):1}   Physical Exam ***  Depression Screen    06/19/2023    9:05 AM 04/07/2022   11:46 AM 01/06/2022   11:19 AM 12/14/2021    9:57 AM  PHQ 2/9 Scores  PHQ - 2 Score 0 0 0 1   No results found for any visits on 03/18/24.  Assessment & Plan     There are no diagnoses linked to this encounter.   No follow-ups on file.      Trenton Frock, PA-C  [facility] at [facility] [phone] (phone) [phone] (fax)  [facility]

## 2024-03-21 ENCOUNTER — Encounter: Payer: Self-pay | Admitting: Physician Assistant

## 2024-03-21 ENCOUNTER — Other Ambulatory Visit (HOSPITAL_BASED_OUTPATIENT_CLINIC_OR_DEPARTMENT_OTHER): Payer: Self-pay

## 2024-03-21 ENCOUNTER — Ambulatory Visit: Admitting: Physician Assistant

## 2024-03-21 VITALS — BP 130/83 | HR 76 | Ht 65.0 in | Wt 217.6 lb

## 2024-03-21 DIAGNOSIS — D509 Iron deficiency anemia, unspecified: Secondary | ICD-10-CM

## 2024-03-21 DIAGNOSIS — R739 Hyperglycemia, unspecified: Secondary | ICD-10-CM

## 2024-03-21 DIAGNOSIS — Z13220 Encounter for screening for lipoid disorders: Secondary | ICD-10-CM | POA: Diagnosis not present

## 2024-03-21 DIAGNOSIS — F332 Major depressive disorder, recurrent severe without psychotic features: Secondary | ICD-10-CM

## 2024-03-21 DIAGNOSIS — D1724 Benign lipomatous neoplasm of skin and subcutaneous tissue of left leg: Secondary | ICD-10-CM

## 2024-03-21 DIAGNOSIS — Z7289 Other problems related to lifestyle: Secondary | ICD-10-CM | POA: Diagnosis not present

## 2024-03-21 MED ORDER — SERTRALINE HCL 100 MG PO TABS
100.0000 mg | ORAL_TABLET | Freq: Every day | ORAL | 2 refills | Status: DC
  Filled 2024-03-21: qty 90, 90d supply, fill #0
  Filled 2024-07-17: qty 90, 90d supply, fill #1

## 2024-03-21 NOTE — Patient Instructions (Signed)
 Safety Plan:  Warning Signs: --- Feeling intense emotional pain you need to release --Feeling isolated --Bad day at work   Coping Mechanisms: --If at work: taking a walk at lunch, taking a break and leaving the building for a few minutes and sitting outside. --deep breathing, box breathing, three part breathing  --listening to music  --roller skating with partner --cooking  People You can talk to! -Partner, Corbin Dess  -Friends from school -Mom and older brother -therapist   Make environment safer: --calling someone --talking a walk   Emergency Mental Health Services:  Crisis Hotline: 988  HandlingCost.fr  Substance Abuse and Mental Health Services Administration Veterinary surgeon) Hotline:  1-800-662-HELP 567-064-0841)  [facility] (like an urgent care for mental health) [address] [place], [place] [address] [phone]

## 2024-03-21 NOTE — Progress Notes (Signed)
 New patient visit   Patient: Cheyenne Schwartz   DOB: [birth year]   27 y.o. Female  MRN: [id number] Visit Date: 03/21/2024  Today's healthcare provider: Trenton Frock, PA-C   Cc. New pt  Subjective    Cheyenne Schwartz is a 28 y.o. female who presents today as a new patient to establish care.   Discussed the use of AI scribe software for clinical note transcription with the patient, who gave verbal consent to proceed.  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 She reports a recent anxiety attack in early April, which led to an episode of self-harm. The patient has a history of self-harm but had been free of such incidents for several years prior to the recent episode. She attributes the anxiety and self-harm to workplace stress and isolation, as well as global events. The patient is currently taking Zoloft  for her mental health issues but admits to sometimes forgetting to take her medication. She also reports experiencing "brain zaps," a common symptom of SSRI discontinuation, when she forgets to take her Zoloft .  In addition to her mental health issues, the patient has a history of anemia and eczema. She has been taking iron pills to address her anemia and reports having had an iron infusion in 2022. She also reports having heavy periods since her teenage years. For her eczema/psoriasis, the patient has been on Dupixent , which she reports has been very helpful. She also has a steroid cream, but has not used it since last year.  The patient is planning to start physical therapy school in January and is concerned about managing her mental health during this time. She has a therapist but has not seen her since December due to insurance issues. She plans to schedule an appointment with her therapist soon.   Pt also reports a small bump on her left shin that she noticed 2-3 days ago.       Past Medical History:  Diagnosis Date   Allergy    Nuts, fish, seafood, pollen, mold, apples   Anemia    iron   Anxiety    Asthma    Depression    Eczema    GERD (gastroesophageal reflux disease)    Palpitations    Ulcer 11/2018   Stomach ulcer   Past Surgical History:  Procedure Laterality Date   IRRIGATION AND DEBRIDEMENT KNEE Left 11/14/2021   Procedure: IRRIGATION AND DEBRIDEMENT KNEE AND HARDWARE REMOVAL;  Surgeon: Neil Balls, MD;  Location: [facility];  Service: Orthopedics;  Laterality: Left;   KNEE ARTHROSCOPY Left    UPPER GI ENDOSCOPY     WISDOM TOOTH EXTRACTION     Family Status  Relation Name Status   Father Gigi [place] Alive   Mother Candida [place] Alive   Brother  Alive   Sister  Alive   Sister  Alive  No partnership data on file   Family History  Problem Relation Age of Onset   Hypertension Father    Arthritis Father    Diabetes Mother    Epilepsy Sister    Myasthenia gravis Sister    Social History   Socioeconomic History   Marital status: Single    Spouse name: Not on file   Number of children: Not on file   Years of education: Not on file   Highest education level: Bachelor's degree (e.g., BA, AB, BS)  Occupational History  Not on file  Tobacco Use   Smoking status: Never   Smokeless tobacco: Never  Vaping Use   Vaping status: Never Used  Substance and Sexual Activity   Alcohol use: Yes    Alcohol/week: 2.0 standard drinks of alcohol    Types: 2 Standard drinks or equivalent per week    Comment: occassional   Drug use: Never   Sexual activity: Yes    Birth control/protection: None  Other Topics Concern   Not on file  Social History Narrative   Not on file   Social Drivers of Health   Financial Resource Strain: Low Risk  (03/18/2024)   Overall Financial Resource Strain (CARDIA)    Difficulty of Paying Living Expenses: Not very hard  Food Insecurity: No Food Insecurity (03/18/2024)   Hunger Vital Sign    Worried About  Running Out of Food in the Last Year: Never true    Ran Out of Food in the Last Year: Never true  Transportation Needs: No Transportation Needs (03/18/2024)   PRAPARE - Administrator, Civil Service (Medical): No    Lack of Transportation (Non-Medical): No  Physical Activity: Insufficiently Active (03/18/2024)   Exercise Vital Sign    Days of Exercise per Week: 4 days    Minutes of Exercise per Session: 30 min  Stress: Stress Concern Present (03/18/2024)   [employer] of Occupational Health - Occupational Stress Questionnaire    Feeling of Stress : Rather much  Social Connections: Moderately Integrated (03/18/2024)   Social Connection and Isolation Panel [NHANES]    Frequency of Communication with Friends and Family: More than three times a week    Frequency of Social Gatherings with Friends and Family: Once a week    Attends Religious Services: More than 4 times per year    Active Member of [employer] or Organizations: Yes    Attends Engineer, structural: More than 4 times per year    Marital Status: Never married   Outpatient Medications Prior to Visit  Medication Sig   Ascorbic Acid (VITAMIN C GUMMIE PO) Take 2 tablets by mouth daily.   cetirizine (ZYRTEC) 10 MG tablet Take 10 mg by mouth daily as needed for allergies.   dupilumab  (DUPIXENT ) 300 MG/2ML prefilled syringe Inject 1 syringe into the skin every other week. (Patient not taking: Reported on 12/04/2023)   EPINEPHrine 0.3 mg/0.3 mL IJ SOAJ injection    ferrous sulfate 325 (65 FE) MG tablet Take 325 mg by mouth daily with breakfast.   triamcinolone ointment (KENALOG) 0.1 % Apply 1 application topically 2 (two) times daily as needed (eczema). (Patient not taking: Reported on 12/04/2023)   [DISCONTINUED] amoxicillin  (AMOXIL ) 500 MG capsule Take 1 capsule (500 mg total) by mouth 3 (three) times daily until gone.   [DISCONTINUED] diazepam  (VALIUM ) 5 MG tablet Take 1 tablet 90 minutes before procedure.  May repeat  once if needed after 60 minutes. (Patient not taking: Reported on 12/04/2023)   [DISCONTINUED] hydrOXYzine  (ATARAX /VISTARIL ) 25 MG tablet Take 1 tablet (25 mg total) by mouth every 8 (eight) hours as needed for anxiety or itching. (Patient not taking: Reported on 11/29/2022)   [DISCONTINUED] metroNIDAZOLE  (FLAGYL ) 500 MG tablet Take 1 tablet (500 mg total) by mouth 2 (two) times daily.   [DISCONTINUED] PROAIR  RESPICLICK 108 (90 Base) MCG/ACT AEPB Inhale 2 puffs into the lungs every 6 (six) hours as needed (SOB, wheezing). (Patient not taking: Reported on 12/04/2023)   [DISCONTINUED] sertraline  (ZOLOFT ) 100 MG tablet Take 1  tablet (100 mg total) by mouth at bedtime. For mood   No facility-administered medications prior to visit.   Allergies  Allergen Reactions   Chocolate Flavoring Agent (Non-Screening) Hives and Itching   Cocoa Hives   Latex Hives and Itching   Other Hives, Itching and Other (See Comments)    runny nose and Itchy throat   Peanut Oil Itching    Itchy Throat  Allergic to most nuts per patient  Itchy Throat Allergic to most nuts per patient     Itchy Throat   Shellfish Allergy Itching    Itchy throat   Apple Itching   Apple Fruit Extract Itching   Bee Pollen Other (See Comments) and Hives    Sneezing; runny nose   Fish Allergy Itching   Fish-Derived Products Other (See Comments) and Itching   Peanut-Containing Drug Products Other (See Comments)   Pollen Extract Other (See Comments)   Short Ragweed Pollen Ext Other (See Comments)   Banana Itching    Itchy Throat     Immunization History  Administered Date(s) Administered   HPV Quadrivalent 11/27/2014, 01/26/2015, 07/29/2015   Hepatitis A, Adult 09/27/2006, 11/28/2007   Influenza,inj,quad, With Preservative 09/10/2019   Influenza-Unspecified 08/27/2004, 09/27/2005, 09/27/2006, 08/28/2007, 09/27/2009, 08/27/2010, 08/28/2011, 07/28/2012, 08/27/2013, 09/27/2014, 08/27/2016, 08/27/2017, 08/27/2018   MMR 01/26/1999,  05/27/2002   Meningococcal B, Unspecified 01/25/2009, 06/27/2014, 01/26/2015, 04/28/2015, 10/28/2015   Pfizer(Comirnaty)Fall Seasonal Vaccine 12 years and older 11/29/2022   Tdap 11/28/2007, 06/27/2016   Varicella 09/27/1998, 09/27/2006    Health Maintenance  Topic Date Due   Pneumococcal Vaccine 45-15 Years old (1 of 2 - PCV) Never done   COVID-19 Vaccine (2 - 2024-25 season) 07/29/2023   INFLUENZA VACCINE  06/27/2024   DTaP/Tdap/Td (3 - Td or Tdap) 06/27/2026   Cervical Cancer Screening (Pap smear)  12/03/2026   HPV VACCINES  Completed   Hepatitis C Screening  Completed   HIV Screening  Completed   Meningococcal B Vaccine  Aged Out    Patient Care Team: Trenton Frock, PA-C as PCP - General (Physician Assistant)  Review of Systems  Constitutional:  Negative for fatigue and fever.  Respiratory:  Negative for cough and shortness of breath.   Cardiovascular:  Negative for chest pain and leg swelling.  Gastrointestinal:  Negative for abdominal pain.  Neurological:  Negative for dizziness and headaches.  Psychiatric/Behavioral:  Positive for suicidal ideas. The patient is nervous/anxious.         Objective    BP 130/83   Pulse 76   Ht 5\' 5"  (1.651 m)   Wt 217 lb 9.6 oz (98.7 kg)   LMP 03/16/2024 (Exact Date)   BMI 36.21 kg/m     Physical Exam Constitutional:      General: She is awake.     Appearance: She is well-developed.  HENT:     Head: Normocephalic.  Eyes:     Conjunctiva/sclera: Conjunctivae normal.  Cardiovascular:     Rate and Rhythm: Normal rate and regular rhythm.     Heart sounds: Normal heart sounds.  Pulmonary:     Effort: Pulmonary effort is normal.     Breath sounds: Normal breath sounds.  Skin:    General: Skin is warm.  Neurological:     Mental Status: She is alert and oriented to person, place, and time.  Psychiatric:        Attention and Perception: Attention normal.        Mood and Affect: Mood normal.  Speech: Speech  normal.        Behavior: Behavior is cooperative.        Thought Content: Thought content includes suicidal ideation. Thought content does not include suicidal plan.     Comments: Passive SI with thoughts of self harm    Depression Screen    03/21/2024    2:33 PM 06/19/2023    9:05 AM 04/07/2022   11:46 AM 01/06/2022   11:19 AM  PHQ 2/9 Scores  PHQ - 2 Score 3 0 0 0  PHQ- 9 Score 10      No results found for any visits on 03/21/24.  Assessment & Plan     Severe recurrent major depression without psychotic features (HCC) Assessment & Plan: Refill zoloft  100 mg . Reports difficulty with medication adherence, experiencing brain zaps as reminders.  Stressed importance of therapy, staying consistent with medications.   Orders: -     Sertraline  HCl; Take 1 tablet (100 mg total) by mouth daily.  Dispense: 90 tablet; Refill: 2  Self-injurious behavior Assessment & Plan: Recent increase in anxiety and significant anxiety attack in early April. Self-harm behavior reported in early April, but no current suicidal ideation. Has not seen therapist since December due to insurance issues. Partner is supportive and encourages therapy. Discussed coping mechanisms and creating a safety plan for self-harm urges. Partner and friends are part of her support system.  We spent some time detailing out a safety plan. We walked through setting up an appointment with her therapist.  - Document coping mechanisms and safety plan in MyChart.    Iron deficiency anemia, unspecified iron deficiency anemia type -     CBC with Differential/Platelet -     Iron, TIBC and Ferritin Panel  Lipid screening -     Lipid panel  Hyperglycemia -     Comprehensive metabolic panel with GFR -     Hemoglobin A1c  Lipoma of left lower extremity   Newly noticed smooth, mobile bump on the ankle, likely a lipoma. No pain or significant growth. - Monitor the size and symptoms of the lipoma. - Consider ultrasound if the  lesion grows or becomes painful.      Return in about 6 months (around 09/20/2024), or if symptoms worsen or fail to improve, for anxiety, depression.      Trenton Frock, PA-C  [facility] [phone] (phone) [phone] (fax)  [facility]

## 2024-03-21 NOTE — Assessment & Plan Note (Addendum)
 Recent increase in anxiety and significant anxiety attack in early April. Self-harm behavior reported in early April, but no current suicidal ideation. Has not seen therapist since December due to insurance issues. Partner is supportive and encourages therapy. Discussed coping mechanisms and creating a safety plan for self-harm urges. Partner and friends are part of her support system.  We spent some time detailing out a safety plan. We walked through setting up an appointment with her therapist.  - Document coping mechanisms and safety plan in MyChart.

## 2024-03-21 NOTE — Assessment & Plan Note (Addendum)
 Refill zoloft  100 mg . Reports difficulty with medication adherence, experiencing brain zaps as reminders.  Stressed importance of therapy, staying consistent with medications.

## 2024-03-22 LAB — COMPREHENSIVE METABOLIC PANEL WITH GFR
AG Ratio: 1.4 (calc) (ref 1.0–2.5)
ALT: 12 U/L (ref 6–29)
AST: 20 U/L (ref 10–30)
Albumin: 4.3 g/dL (ref 3.6–5.1)
Alkaline phosphatase (APISO): 75 U/L (ref 31–125)
BUN: 8 mg/dL (ref 7–25)
CO2: 25 mmol/L (ref 20–32)
Calcium: 9.3 mg/dL (ref 8.6–10.2)
Chloride: 101 mmol/L (ref 98–110)
Creat: 0.7 mg/dL (ref 0.50–0.96)
Globulin: 3 g/dL (ref 1.9–3.7)
Glucose, Bld: 78 mg/dL (ref 65–99)
Potassium: 4.1 mmol/L (ref 3.5–5.3)
Sodium: 136 mmol/L (ref 135–146)
Total Bilirubin: 0.5 mg/dL (ref 0.2–1.2)
Total Protein: 7.3 g/dL (ref 6.1–8.1)
eGFR: 122 mL/min/{1.73_m2} (ref >=60)

## 2024-03-22 LAB — CBC WITH DIFFERENTIAL/PLATELET
Absolute Lymphocytes: 2805 {cells}/uL (ref 850–3900)
Absolute Monocytes: 495 {cells}/uL (ref 200–950)
Basophils Absolute: 40 {cells}/uL (ref 0–200)
Basophils Relative: 0.6 %
Eosinophils Absolute: 416 {cells}/uL (ref 15–500)
Eosinophils Relative: 6.3 %
HCT: 34.6 % — ABNORMAL LOW (ref 35.0–45.0)
Hemoglobin: 11 g/dL — ABNORMAL LOW (ref 11.7–15.5)
MCH: 24.3 pg — ABNORMAL LOW (ref 27.0–33.0)
MCHC: 31.8 g/dL — ABNORMAL LOW (ref 32.0–36.0)
MCV: 76.5 fL — ABNORMAL LOW (ref 80.0–100.0)
MPV: 10.3 fL (ref 7.5–12.5)
Monocytes Relative: 7.5 %
Neutro Abs: 2845 {cells}/uL (ref 1500–7800)
Neutrophils Relative %: 43.1 %
Platelets: 442 10*3/uL — ABNORMAL HIGH (ref 140–400)
RBC: 4.52 10*6/uL (ref 3.80–5.10)
RDW: 13.7 % (ref 11.0–15.0)
Total Lymphocyte: 42.5 %
WBC: 6.6 10*3/uL (ref 3.8–10.8)

## 2024-03-22 LAB — IRON,TIBC AND FERRITIN PANEL
%SAT: 7 % — ABNORMAL LOW (ref 16–45)
Ferritin: 6 ng/mL — ABNORMAL LOW (ref 16–154)
Iron: 35 ug/dL — ABNORMAL LOW (ref 40–190)
TIBC: 486 ug/dL — ABNORMAL HIGH (ref 250–450)

## 2024-03-22 LAB — LIPID PANEL
Cholesterol: 242 mg/dL — ABNORMAL HIGH (ref <200)
HDL: 52 mg/dL (ref >=50)
LDL Cholesterol (Calc): 165 mg/dL — ABNORMAL HIGH
Non-HDL Cholesterol (Calc): 190 mg/dL — ABNORMAL HIGH (ref <130)
Total CHOL/HDL Ratio: 4.7 (calc) (ref <5.0)
Triglycerides: 119 mg/dL (ref <150)

## 2024-03-22 LAB — HEMOGLOBIN A1C
Hgb A1c MFr Bld: 5 % (ref <5.7)
Mean Plasma Glucose: 97 mg/dL
eAG (mmol/L): 5.4 mmol/L

## 2024-03-24 ENCOUNTER — Encounter: Payer: Self-pay | Admitting: Physician Assistant

## 2024-03-24 DIAGNOSIS — E782 Mixed hyperlipidemia: Secondary | ICD-10-CM | POA: Insufficient documentation

## 2024-03-24 NOTE — Addendum Note (Signed)
 Addended by: Valda Christenson D on: 03/24/2024 10:27 AM   Modules accepted: Orders

## 2024-03-25 ENCOUNTER — Ambulatory Visit: Admitting: Physician Assistant

## 2024-03-31 ENCOUNTER — Telehealth: Payer: Self-pay | Admitting: Physician Assistant

## 2024-03-31 NOTE — Telephone Encounter (Signed)
 Rescheduled appointments per 4/21 IB message. Left the patient a voicemail with the rescheduled appointment details. The patient is active on MyChart.

## 2024-05-02 ENCOUNTER — Other Ambulatory Visit

## 2024-05-02 ENCOUNTER — Ambulatory Visit: Admitting: Physician Assistant

## 2024-05-06 NOTE — Progress Notes (Unsigned)
 [facility] OFFICE PROGRESS NOTE  Trenton Frock, PA-C [address] [place] [address]  DIAGNOSIS: iron deficiency anemia secondary to menorrhagia.   PRIOR THERAPY: 1) IV iron with venofer PRN, most recent dose ~2022 in [place]   CURRENT THERAPY: Oral iron supplement with OTC ferrous sulfate every other day with vitamin C   INTERVAL HISTORY: Cheyenne Schwartz 28 y.o. female returns fto the clinic today for a follow-up visit.  She was last seen by myself in December 2024 for a follow up.   I did offer IV iron infusions at her last appointment via mychart message which ***the patient declined ***they tried to reach her.   The patient is followed for her history of iron deficiency anemia.  She has not had any iron infusions in several years.  She is compliant with her oral iron supplement p.o.  every other day.  Since last being seen she denies any major changes in her health.      Overall, she is feeling "***".  When she has worsening iron deficiency, she reports craving ice chips.  She states she is not*** craving any ice chips at this time.  Her energy is up-and-down*** but overall fairly good*** at this time.  She denies any shortness of breath.  Denies any other abnormal bleeding or bruising.  She still continues to have heavy menstrual cycles.  Her menstrual periods last 7*** days with associated clots.  She feels like her menstrual cycles have been heavier recently***.  She saw her PCP and GYN who recommended birth control but she opted to think about her options****She saw them for a follow up visit on 12/04/23 for a pap smear. She is trying to increase her iron rich food and spinach. Otherwise she denies any other abnormal bleeding or bruising.  Denies any lightheadedness or syncope.  She is here for evaluation and repeat blood work.   MEDICAL HISTORY: Past Medical History:  Diagnosis Date   Allergy    Nuts, fish, seafood, pollen, mold, apples   Anemia     iron   Anxiety    Asthma    Depression    Eczema    GERD (gastroesophageal reflux disease)    Palpitations    Ulcer 11/2018   Stomach ulcer    ALLERGIES:  is allergic to chocolate flavoring agent (non-screening), cocoa, latex, other, peanut oil, shellfish allergy, apple, apple fruit extract, bee pollen, fish allergy, fish-derived products, peanut-containing drug products, pollen extract, short ragweed pollen ext, and banana.  MEDICATIONS:  Current Outpatient Medications  Medication Sig Dispense Refill   Ascorbic Acid (VITAMIN C GUMMIE PO) Take 2 tablets by mouth daily.     cetirizine (ZYRTEC) 10 MG tablet Take 10 mg by mouth daily as needed for allergies.     dupilumab  (DUPIXENT ) 300 MG/2ML prefilled syringe Inject 1 syringe into the skin every other week. (Patient not taking: Reported on 12/04/2023) 4 mL 11   EPINEPHrine 0.3 mg/0.3 mL IJ SOAJ injection      ferrous sulfate 325 (65 FE) MG tablet Take 325 mg by mouth daily with breakfast.     sertraline  (ZOLOFT ) 100 MG tablet Take 1 tablet (100 mg total) by mouth daily. 90 tablet 2   triamcinolone ointment (KENALOG) 0.1 % Apply 1 application topically 2 (two) times daily as needed (eczema). (Patient not taking: Reported on 12/04/2023)     No current facility-administered medications for this visit.    SURGICAL HISTORY:  Past Surgical History:  Procedure Laterality Date   IRRIGATION AND DEBRIDEMENT KNEE Left 11/14/2021   Procedure: IRRIGATION AND DEBRIDEMENT KNEE AND HARDWARE REMOVAL;  Surgeon: Neil Balls, MD;  Location: [facility];  Service: Orthopedics;  Laterality: Left;   KNEE ARTHROSCOPY Left    UPPER GI ENDOSCOPY     WISDOM TOOTH EXTRACTION      REVIEW OF SYSTEMS:   Review of Systems  Constitutional: Negative for appetite change, chills, fatigue, fever and unexpected weight change.  HENT:   Negative for mouth sores, nosebleeds, sore throat and trouble swallowing.   Eyes: Negative for eye problems and icterus.  Respiratory:  Negative for cough, hemoptysis, shortness of breath and wheezing.   Cardiovascular: Negative for chest pain and leg swelling.  Gastrointestinal: Negative for abdominal pain, constipation, diarrhea, nausea and vomiting.  Genitourinary: Negative for bladder incontinence, difficulty urinating, dysuria, frequency and hematuria.   Musculoskeletal: Negative for back pain, gait problem, neck pain and neck stiffness.  Skin: Negative for itching and rash.  Neurological: Negative for dizziness, extremity weakness, gait problem, headaches, light-headedness and seizures.  Hematological: Negative for adenopathy. Does not bruise/bleed easily.  Psychiatric/Behavioral: Negative for confusion, depression and sleep disturbance. The patient is not nervous/anxious.     PHYSICAL EXAMINATION:  There were no vitals taken for this visit.  ECOG PERFORMANCE STATUS: {CHL ONC ECOG [id number]  Physical Exam  Constitutional: Oriented to person, place, and time and well-developed, well-nourished, and in no distress. No distress.  HENT:  Head: Normocephalic and atraumatic.  Mouth/Throat: Oropharynx is clear and moist. No oropharyngeal exudate.  Eyes: Conjunctivae are normal. Right eye exhibits no discharge. Left eye exhibits no discharge. No scleral icterus.  Neck: Normal range of motion. Neck supple.  Cardiovascular: Normal rate, regular rhythm, normal heart sounds and intact distal pulses.   Pulmonary/Chest: Effort normal and breath sounds normal. No respiratory distress. No wheezes. No rales.  Abdominal: Soft. Bowel sounds are normal. Exhibits no distension and no mass. There is no tenderness.  Musculoskeletal: Normal range of motion. Exhibits no edema.  Lymphadenopathy:    No cervical adenopathy.  Neurological: Alert and oriented to person, place, and time. Exhibits normal muscle tone. Gait normal. Coordination normal.  Skin: Skin is warm and dry. No rash noted. Not diaphoretic. No erythema. No pallor.   Psychiatric: Mood, memory and judgment normal.  Vitals reviewed.  LABORATORY DATA: Lab Results  Component Value Date   WBC 6.6 03/21/2024   HGB 11.0 (L) 03/21/2024   HCT 34.6 (L) 03/21/2024   MCV 76.5 (L) 03/21/2024   PLT 442 (H) 03/21/2024      Chemistry      Component Value Date/Time   NA 136 03/21/2024 1431   K 4.1 03/21/2024 1431   CL 101 03/21/2024 1431   CO2 25 03/21/2024 1431   BUN 8 03/21/2024 1431   CREATININE 0.70 03/21/2024 1431      Component Value Date/Time   CALCIUM 9.3 03/21/2024 1431   ALKPHOS 71 07/07/2023 0820   AST 20 03/21/2024 1431   AST 13 (L) 07/07/2023 0820   ALT 12 03/21/2024 1431   ALT 15 07/07/2023 0820   BILITOT 0.5 03/21/2024 1431   BILITOT 0.3 07/07/2023 0820       RADIOGRAPHIC STUDIES:  No results found.   ASSESSMENT/PLAN:  This is a very pleasant 28 year old African-American female with iron deficiency anemia secondary to menorrhagia.  She was treated in the past with IV iron infusions with Venofer in 2022.  She is currently on over-the-counter iron supplements since March  2024.  At her last appointment I recommended IV iron but she ***   She had repeat lab work today showing hemoglobin of *** and her MCV ***. I am waiting for her iron results to come back.    She continue with her oral iron supplement with vitamin C daily or every other day.  If her labs continue to show significant iron deficiency, then we will arrange for IV iron infusions. I will send her a mychart message with the results.    We will see her back for follow-up visit with repeat labs in 3 months.   She will follow-up with her gynecologist regarding her menorrhagia. She is thinking out OCPs at this time. ***  The patient was advised to call immediately if she has any concerning symptoms in the interval. The patient voices understanding of current disease status and treatment options and is in agreement with the current care plan. All questions were answered.  The patient knows to call the clinic with any problems, questions or concerns. We can certainly see the patient much sooner if necessary   No orders of the defined types were placed in this encounter.    I spent {CHL ONC TIME VISIT - ZOXWR:[id number]} counseling the patient face to face. The total time spent in the appointment was {CHL ONC TIME VISIT - BJYNW:[id number]}.  Naoma Boxell L Axten Pascucci, PA-C 05/06/24

## 2024-05-12 ENCOUNTER — Inpatient Hospital Stay (HOSPITAL_BASED_OUTPATIENT_CLINIC_OR_DEPARTMENT_OTHER): Admitting: Physician Assistant

## 2024-05-12 ENCOUNTER — Inpatient Hospital Stay: Attending: Physician Assistant

## 2024-05-12 ENCOUNTER — Telehealth: Payer: Self-pay

## 2024-05-12 VITALS — BP 118/83 | HR 81 | Temp 97.8°F | Resp 17 | Ht 65.0 in | Wt 220.3 lb

## 2024-05-12 DIAGNOSIS — N920 Excessive and frequent menstruation with regular cycle: Secondary | ICD-10-CM | POA: Insufficient documentation

## 2024-05-12 DIAGNOSIS — D500 Iron deficiency anemia secondary to blood loss (chronic): Secondary | ICD-10-CM | POA: Diagnosis not present

## 2024-05-12 LAB — CBC WITH DIFFERENTIAL (CANCER CENTER ONLY)
Abs Immature Granulocytes: 0.01 10*3/uL (ref 0.00–0.07)
Basophils Absolute: 0 10*3/uL (ref 0.0–0.1)
Basophils Relative: 1 %
Eosinophils Absolute: 0.3 10*3/uL (ref 0.0–0.5)
Eosinophils Relative: 6 %
HCT: 33 % — ABNORMAL LOW (ref 36.0–46.0)
Hemoglobin: 10.6 g/dL — ABNORMAL LOW (ref 12.0–15.0)
Immature Granulocytes: 0 %
Lymphocytes Relative: 35 %
Lymphs Abs: 2 10*3/uL (ref 0.7–4.0)
MCH: 24.1 pg — ABNORMAL LOW (ref 26.0–34.0)
MCHC: 32.1 g/dL (ref 30.0–36.0)
MCV: 75 fL — ABNORMAL LOW (ref 80.0–100.0)
Monocytes Absolute: 0.5 10*3/uL (ref 0.1–1.0)
Monocytes Relative: 8 %
Neutro Abs: 2.9 10*3/uL (ref 1.7–7.7)
Neutrophils Relative %: 50 %
Platelet Count: 374 10*3/uL (ref 150–400)
RBC: 4.4 MIL/uL (ref 3.87–5.11)
RDW: 14.1 % (ref 11.5–15.5)
WBC Count: 5.8 10*3/uL (ref 4.0–10.5)
nRBC: 0 % (ref 0.0–0.2)

## 2024-05-12 LAB — IRON AND IRON BINDING CAPACITY (CC-WL,HP ONLY)
Iron: 44 ug/dL (ref 28–170)
Saturation Ratios: 9 % — ABNORMAL LOW (ref 10.4–31.8)
TIBC: 494 ug/dL — ABNORMAL HIGH (ref 250–450)
UIBC: 450 ug/dL — ABNORMAL HIGH (ref 148–442)

## 2024-05-12 LAB — FERRITIN: Ferritin: 17 ng/mL (ref 11–307)

## 2024-05-12 NOTE — Progress Notes (Signed)
 [facility] OFFICE PROGRESS NOTE  Trenton Frock, PA-C [address] [place] [address]   DIAGNOSIS: iron deficiency anemia secondary to menorrhagia.   PRIOR THERAPY: 1) IV iron with venofer PRN, most recent dose ~2022 in [place]   CURRENT THERAPY: Oral iron supplement with OTC ferrous sulfate every other day with vitamin C   INTERVAL HISTORY: Cheyenne Schwartz 28 y.o. female returns fto the clinic today for a follow-up visit.  She was last seen by myself in December 2024 for a follow up.   She experiences fatigue and heavy menstrual bleeding, with her menstrual periods lasting about seven days and accompanied by clots. She has not been using birth control to manage her menstrual bleeding. She occasionally takes oral iron supplements, approximately once a week when she remembers. She incorporates iron-rich foods into her diet, such as spinach and ground beef.   She has cravings for ice chips, a symptom known as pica, often associated with iron deficiency. No shortness of breath or abnormal bleeding or bruising aside from her menstrual cycle. Her recent blood work showed a hemoglobin level of 10.6, indicating anemia. She has not had an iron infusion in a couple of years. She had labs drawn at her PCP's office in April which showed iron deficiency.   She works at [employer] in [place] and plans to attend PT school at [employer] in six months. She has noticed her hands peeling, which she attributes to frequent use of hand sanitizer and possibly her eczema, although she usually does not experience this symptom.  She is here for evaluation and repeat blood work.   MEDICAL HISTORY: Past Medical History:  Diagnosis Date   Allergy    Nuts, fish, seafood, pollen, mold, apples   Anemia    iron   Anxiety    Asthma    Depression    Eczema    GERD (gastroesophageal reflux disease)    Palpitations    Ulcer 11/2018   Stomach ulcer    ALLERGIES:  is allergic  to chocolate flavoring agent (non-screening), cocoa, latex, other, peanut oil, shellfish allergy, apple, apple fruit extract, bee pollen, fish allergy, fish-derived products, peanut-containing drug products, pollen extract, short ragweed pollen ext, and banana.  MEDICATIONS:  Current Outpatient Medications  Medication Sig Dispense Refill   Ascorbic Acid (VITAMIN C GUMMIE PO) Take 2 tablets by mouth daily.     cetirizine (ZYRTEC) 10 MG tablet Take 10 mg by mouth daily as needed for allergies.     dupilumab  (DUPIXENT ) 300 MG/2ML prefilled syringe Inject 1 syringe into the skin every other week. (Patient not taking: Reported on 12/04/2023) 4 mL 11   EPINEPHrine 0.3 mg/0.3 mL IJ SOAJ injection      ferrous sulfate 325 (65 FE) MG tablet Take 325 mg by mouth daily with breakfast.     sertraline  (ZOLOFT ) 100 MG tablet Take 1 tablet (100 mg total) by mouth daily. 90 tablet 2   triamcinolone ointment (KENALOG) 0.1 % Apply 1 application topically 2 (two) times daily as needed (eczema). (Patient not taking: Reported on 12/04/2023)     No current facility-administered medications for this visit.    SURGICAL HISTORY:  Past Surgical History:  Procedure Laterality Date   IRRIGATION AND DEBRIDEMENT KNEE Left 11/14/2021   Procedure: IRRIGATION AND DEBRIDEMENT KNEE AND HARDWARE REMOVAL;  Surgeon: Neil Balls, MD;  Location: [facility];  Service: Orthopedics;  Laterality: Left;   KNEE ARTHROSCOPY Left    UPPER GI  ENDOSCOPY     WISDOM TOOTH EXTRACTION      REVIEW OF SYSTEMS:   Review of Systems  Constitutional: Positive for fatigue. Negative for appetite change, chills, fever and unexpected weight change.  HENT: Negative for mouth sores, nosebleeds, sore throat and trouble swallowing.   Eyes: Negative for eye problems and icterus.  Respiratory: Negative for cough, hemoptysis, shortness of breath and wheezing.   Cardiovascular: Negative for chest pain and leg swelling.  Gastrointestinal: Negative for abdominal  pain, constipation, diarrhea, nausea and vomiting.  Genitourinary: Negative for bladder incontinence, difficulty urinating, dysuria, frequency and hematuria.   Musculoskeletal: Negative for back pain, gait problem, neck pain and neck stiffness.  Skin: Negative for itching and rash.  Neurological: Negative for dizziness, extremity weakness, gait problem, headaches, light-headedness and seizures.  Hematological: Negative for adenopathy. Does not bruise/bleed easily.  Psychiatric/Behavioral: Negative for confusion, depression and sleep disturbance. The patient is not nervous/anxious.     PHYSICAL EXAMINATION:  There were no vitals taken for this visit.  ECOG PERFORMANCE STATUS: 1  Physical Exam  Constitutional: Oriented to person, place, and time and well-developed, well-nourished, and in no distress. HENT:  Head: Normocephalic and atraumatic.  Mouth/Throat: Oropharynx is clear and moist. No oropharyngeal exudate.  Eyes: Conjunctivae are normal. Right eye exhibits no discharge. Left eye exhibits no discharge. No scleral icterus.  Neck: Normal range of motion. Neck supple.  Cardiovascular: Normal rate, regular rhythm, normal heart sounds and intact distal pulses.   Pulmonary/Chest: Effort normal and breath sounds normal. No respiratory distress. No wheezes. No rales.  Abdominal: Soft. Bowel sounds are normal. Exhibits no distension and no mass. There is no tenderness.  Musculoskeletal: Normal range of motion. Exhibits no edema.  Lymphadenopathy:    No cervical adenopathy.  Neurological: Alert and oriented to person, place, and time. Exhibits normal muscle tone. Gait normal. Coordination normal.  Skin: Skin is warm and dry. No rash noted. Not diaphoretic. No erythema. No pallor.  Psychiatric: Mood, memory and judgment normal.  Vitals reviewed. LABORATORY DATA: Lab Results  Component Value Date   WBC 6.6 03/21/2024   HGB 11.0 (L) 03/21/2024   HCT 34.6 (L) 03/21/2024   MCV 76.5 (L)  03/21/2024   PLT 442 (H) 03/21/2024      Chemistry      Component Value Date/Time   NA 136 03/21/2024 1431   K 4.1 03/21/2024 1431   CL 101 03/21/2024 1431   CO2 25 03/21/2024 1431   BUN 8 03/21/2024 1431   CREATININE 0.70 03/21/2024 1431      Component Value Date/Time   CALCIUM 9.3 03/21/2024 1431   ALKPHOS 71 07/07/2023 0820   AST 20 03/21/2024 1431   AST 13 (L) 07/07/2023 0820   ALT 12 03/21/2024 1431   ALT 15 07/07/2023 0820   BILITOT 0.5 03/21/2024 1431   BILITOT 0.3 07/07/2023 0820       RADIOGRAPHIC STUDIES:  No results found.    ASSESSMENT/PLAN:  This is a very pleasant 28 year old African-American female with iron deficiency anemia secondary to menorrhagia.  She was treated in the past with IV iron infusions with Venofer in 2022.  She is currently on over-the-counter iron supplements since March 2024.  At her last appointment I recommended IV iron but it does not appear they were able to reach her. She had iron studies at her PCP's office in April which showed worsening IDA.   I went ahead an ordered IV iron infusions. I discussed this with the patient. I  ordered IV iron with venofer 200 mg weekly x5.   Her Hbg is 10.6    She continue with her oral iron supplement with vitamin C daily or every other day.   We will see her back for follow-up visit with repeat labs in 3 months.   The patient was advised to call immediately if she has any concerning symptoms in the interval. The patient voices understanding of current disease status and treatment options and is in agreement with the current care plan. All questions were answered. The patient knows to call the clinic with any problems, questions or concerns. We can certainly see the patient much sooner if necessary   No orders of the defined types were placed in this encounter.    The total time spent in the appointment was 20-29 minutes  Ahmad Vanwey L Gelsey Amyx, PA-C 05/12/24

## 2024-05-12 NOTE — Telephone Encounter (Signed)
 Cheyenne Schwartz, patient will be scheduled as soon as possible.  Auth Submission: NO AUTH NEEDED Site of care: Site of care: [facility] Payer: Geneticist, molecular Medication & CPT/J Code(s) submitted: Venofer (Iron Sucrose) J1756 Diagnosis Code:  Route of submission (phone, fax, portal):  Phone # Fax # Auth type: Buy/Bill PB Units/visits requested: 200mg  x 5 doses Reference number:  Approval from: 05/12/24 to 09/11/24

## 2024-05-13 ENCOUNTER — Telehealth: Payer: Self-pay | Admitting: Physician Assistant

## 2024-05-13 NOTE — Telephone Encounter (Signed)
 Scheduled appointments per 6/16 los. Left VM with appointment details.

## 2024-07-17 ENCOUNTER — Other Ambulatory Visit (HOSPITAL_BASED_OUTPATIENT_CLINIC_OR_DEPARTMENT_OTHER): Payer: Self-pay

## 2024-07-17 ENCOUNTER — Other Ambulatory Visit: Payer: Self-pay

## 2024-07-18 ENCOUNTER — Other Ambulatory Visit: Payer: Self-pay

## 2024-07-18 ENCOUNTER — Ambulatory Visit (INDEPENDENT_AMBULATORY_CARE_PROVIDER_SITE_OTHER): Admitting: Physician Assistant

## 2024-07-18 ENCOUNTER — Other Ambulatory Visit (HOSPITAL_BASED_OUTPATIENT_CLINIC_OR_DEPARTMENT_OTHER): Payer: Self-pay

## 2024-07-18 VITALS — BP 116/77 | HR 76 | Ht 65.0 in | Wt 213.2 lb

## 2024-07-18 DIAGNOSIS — E782 Mixed hyperlipidemia: Secondary | ICD-10-CM

## 2024-07-18 DIAGNOSIS — D509 Iron deficiency anemia, unspecified: Secondary | ICD-10-CM | POA: Diagnosis not present

## 2024-07-18 DIAGNOSIS — Z0000 Encounter for general adult medical examination without abnormal findings: Secondary | ICD-10-CM | POA: Diagnosis not present

## 2024-07-18 DIAGNOSIS — F332 Major depressive disorder, recurrent severe without psychotic features: Secondary | ICD-10-CM | POA: Diagnosis not present

## 2024-07-18 NOTE — Progress Notes (Signed)
 Complete physical exam   Patient: Cheyenne Schwartz   DOB: Nov 18, 1996   27 y.o. Female  MRN: [id number] Visit Date: 07/18/2024  Today's healthcare provider: Manuelita Flatness, PA-C   Cc. cpe  Subjective    Cheyenne Schwartz is a 28 y.o. female who presents today for a complete physical exam.   No acute concerns.   She has been taking iron supplements, but they cause constipation. She was referred for infusions, but did not schedule 2/2 to cost as she was unsure how much they would be. She was advised to contact her insurance.  Past Medical History:  Diagnosis Date   Allergy    Nuts, fish, seafood, pollen, mold, apples   Anemia    iron   Anxiety    Asthma    Depression    Eczema    GERD (gastroesophageal reflux disease)    Palpitations    Ulcer 11/2018   Stomach ulcer   Past Surgical History:  Procedure Laterality Date   IRRIGATION AND DEBRIDEMENT KNEE Left 11/14/2021   Procedure: IRRIGATION AND DEBRIDEMENT KNEE AND HARDWARE REMOVAL;  Surgeon: Yvone Rush, MD;  Location: [facility];  Service: Orthopedics;  Laterality: Left;   KNEE ARTHROSCOPY Left    UPPER GI ENDOSCOPY     WISDOM TOOTH EXTRACTION     Social History   Socioeconomic History   Marital status: Single    Spouse name: Not on file   Number of children: Not on file   Years of education: Not on file   Highest education level: Bachelor's degree (e.g., BA, AB, BS)  Occupational History   Not on file  Tobacco Use   Smoking status: Never   Smokeless tobacco: Never  Vaping Use   Vaping status: Never Used  Substance and Sexual Activity   Alcohol use: Yes    Alcohol/week: 2.0 standard drinks of alcohol    Types: 2 Standard drinks or equivalent per week    Comment: occassional   Drug use: Never   Sexual activity: Yes    Birth control/protection: None  Other Topics Concern   Not on file  Social History Narrative   Not on file   Social Drivers of Health   Financial Resource Strain: Low Risk  (07/18/2024)    Overall Financial Resource Strain (CARDIA)    Difficulty of Paying Living Expenses: Not very hard  Food Insecurity: No Food Insecurity (07/18/2024)   Hunger Vital Sign    Worried About Running Out of Food in the Last Year: Never true    Ran Out of Food in the Last Year: Never true  Transportation Needs: No Transportation Needs (07/18/2024)   PRAPARE - Transportation    Lack of Transportation (Medical): No    Lack of Transportation (Non-Medical): No  Physical Activity: Sufficiently Active (07/18/2024)   Exercise Vital Sign    Days of Exercise per Week: 5 days    Minutes of Exercise per Session: 30 min  Stress: No Stress Concern Present (07/18/2024)   [employer] of Occupational Health - Occupational Stress Questionnaire    Feeling of Stress: Only a little  Social Connections: Moderately Integrated (07/18/2024)   Social Connection and Isolation Panel    Frequency of Communication with Friends and Family: More than three times a week    Frequency of Social Gatherings with Friends and Family: Once a week    Attends Religious Services: More than 4 times per year    Active Member of [employer] or Organizations: Yes    Attends [employer]  or Organization Meetings: More than 4 times per year    Marital Status: Never married  Intimate Partner Violence: Unknown (01/30/2023)   Received from [facility]   HITS    Physically Hurt: Not on file    Insult or Talk Down To: Not on file    Threaten Physical Harm: Not on file    Scream or Curse: Not on file   Family Status  Relation Name Status   Father Darian Ace Alive   Mother Candida Dealer   Brother  Alive   Sister  Alive   Sister  Alive  No partnership data on file   Family History  Problem Relation Age of Onset   Hypertension Father    Arthritis Father    Diabetes Mother    Epilepsy Sister    Myasthenia gravis Sister    Allergies  Allergen Reactions   Chocolate Flavoring Agent (Non-Screening) Hives and Itching   Cocoa Hives    Latex Hives and Itching   Other Hives, Itching and Other (See Comments)    runny nose and Itchy throat   Peanut Oil Itching    Itchy Throat  Allergic to most nuts per patient  Itchy Throat Allergic to most nuts per patient     Itchy Throat   Shellfish Allergy Itching    Itchy throat   Apple Itching   Apple Fruit Extract Itching   Bee Pollen Other (See Comments) and Hives    Sneezing; runny nose   Fish Allergy Itching   Fish-Derived Products Other (See Comments) and Itching   Peanut-Containing Drug Products Other (See Comments)   Pollen Extract Other (See Comments)   Short Ragweed Pollen Ext Other (See Comments)   Banana Itching    Itchy Throat     Patient Care Team: Cyndi Shaver, PA-C as PCP - General (Physician Assistant)   Medications: Outpatient Medications Prior to Visit  Medication Sig   Ascorbic Acid (VITAMIN C GUMMIE PO) Take 2 tablets by mouth daily.   cetirizine (ZYRTEC) 10 MG tablet Take 10 mg by mouth daily as needed for allergies.   dupilumab  (DUPIXENT ) 300 MG/2ML prefilled syringe Inject 1 syringe into the skin every other week. (Patient not taking: Reported on 12/04/2023)   EPINEPHrine 0.3 mg/0.3 mL IJ SOAJ injection    ferrous sulfate 325 (65 FE) MG tablet Take 325 mg by mouth daily with breakfast.   sertraline  (ZOLOFT ) 100 MG tablet Take 1 tablet (100 mg total) by mouth daily.   triamcinolone ointment (KENALOG) 0.1 % Apply 1 application topically 2 (two) times daily as needed (eczema). (Patient not taking: Reported on 12/04/2023)   No facility-administered medications prior to visit.    Review of Systems  Constitutional:  Negative for fatigue and fever.  Respiratory:  Negative for cough and shortness of breath.   Cardiovascular:  Negative for chest pain and leg swelling.  Gastrointestinal:  Negative for abdominal pain.  Neurological:  Negative for dizziness and headaches.      Objective    BP 116/77   Pulse 76   Ht 5' 5 (1.651 m)   Wt 213 lb  3.2 oz (96.7 kg)   BMI 35.48 kg/m    Physical Exam Constitutional:      General: She is awake.     Appearance: She is well-developed. She is not ill-appearing.  HENT:     Head: Normocephalic.     Right Ear: Tympanic membrane normal.     Left Ear: Tympanic membrane normal.  Nose: Nose normal. No congestion or rhinorrhea.     Mouth/Throat:     Pharynx: No oropharyngeal exudate or posterior oropharyngeal erythema.  Eyes:     Conjunctiva/sclera: Conjunctivae normal.     Pupils: Pupils are equal, round, and reactive to light.  Neck:     Thyroid : No thyroid  mass or thyromegaly.  Cardiovascular:     Rate and Rhythm: Normal rate and regular rhythm.     Heart sounds: Normal heart sounds.  Pulmonary:     Effort: Pulmonary effort is normal.     Breath sounds: Normal breath sounds.  Abdominal:     Palpations: Abdomen is soft.     Tenderness: There is no abdominal tenderness.  Musculoskeletal:     Right lower leg: No swelling. No edema.     Left lower leg: No swelling. No edema.  Lymphadenopathy:     Cervical: No cervical adenopathy.  Skin:    General: Skin is warm.  Neurological:     Mental Status: She is alert and oriented to person, place, and time.  Psychiatric:        Attention and Perception: Attention normal.        Mood and Affect: Mood normal.        Speech: Speech normal.        Behavior: Behavior normal. Behavior is cooperative.     Last depression screening scores    07/18/2024    1:48 PM 03/21/2024    2:33 PM 06/19/2023    9:05 AM  PHQ 2/9 Scores  PHQ - 2 Score 1 3 0  PHQ- 9 Score  10    Last fall risk screening    07/18/2024    1:47 PM  Fall Risk   Falls in the past year? 0  Number falls in past yr: 0  Injury with Fall? 0  Risk for fall due to : No Fall Risks  Follow up Falls evaluation completed   Last Audit-C alcohol use screening    07/18/2024    9:35 AM  Alcohol Use Disorder Test (AUDIT)  1. How often do you have a drink containing alcohol? 2   2. How many drinks containing alcohol do you have on a typical day when you are drinking? 0  3. How often do you have six or more drinks on one occasion? 0  AUDIT-C Score 2      Patient-reported   A score of 3 or more in women, and 4 or more in men indicates increased risk for alcohol abuse, EXCEPT if all of the points are from question 1   No results found for any visits on 07/18/24.  Assessment & Plan    Routine Health Maintenance and Physical Exam  Exercise Activities and Dietary recommendations   --balanced diet high in fiber and protein, low in sugars, carbs, fats. --physical activity/exercise 20-30 minutes 3-5 times a week    Immunization History  Administered Date(s) Administered   HPV Quadrivalent 11/27/2014, 01/26/2015, 07/29/2015   Hepatitis A, Adult 09/27/2006, 11/28/2007   Influenza,inj,quad, With Preservative 09/10/2019   Influenza-Unspecified 08/27/2004, 09/27/2005, 09/27/2006, 08/28/2007, 09/27/2009, 08/27/2010, 08/28/2011, 07/28/2012, 08/27/2013, 09/27/2014, 08/27/2016, 08/27/2017, 08/27/2018   MMR 01/26/1999, 05/27/2002   Meningococcal B, Unspecified 01/25/2009, 06/27/2014, 01/26/2015, 04/28/2015, 10/28/2015   Pfizer(Comirnaty)Fall Seasonal Vaccine 12 years and older 11/29/2022   Tdap 11/28/2007, 06/27/2016   Varicella 09/27/1998, 09/27/2006    Health Maintenance  Topic Date Due   Pneumococcal Vaccine (1 of 2 - PCV) Never done   Hepatitis B Vaccines  19-59 Average Risk (1 of 3 - 19+ 3-dose series) Never done   COVID-19 Vaccine (2 - 2024-25 season) 07/29/2023   INFLUENZA VACCINE  06/27/2024   DTaP/Tdap/Td (3 - Td or Tdap) 06/27/2026   Cervical Cancer Screening (Pap smear)  12/03/2026   HPV VACCINES  Completed   Hepatitis C Screening  Completed   HIV Screening  Completed   Meningococcal B Vaccine  Aged Out    Discussed health benefits of physical activity, and encouraged her to engage in regular exercise appropriate for her age and condition.  Problem  List Items Addressed This Visit       Other   Severe recurrent major depression without psychotic features (HCC) - Primary   Iron deficiency anemia   Relevant Orders   IBC + Ferritin   CBC w/Diff   Comp Met (CMET)   Hyperlipidemia, mixed   Relevant Orders   Lipid panel   Other Visit Diagnoses       Annual physical exam       Relevant Orders   Lipid panel   CBC w/Diff   Comp Met (CMET)   TSH        Return in about 6 months (around 01/18/2025) for anxiety, depression.     Manuelita Flatness, PA-C  [facility] [phone] (phone) [phone] (fax)  [facility]

## 2024-07-18 NOTE — Progress Notes (Signed)
 Specialty Pharmacy Initial Fill Coordination Note  Cheyenne Schwartz is a 28 y.o. female contacted today regarding initial fill of specialty medication(s) Dupilumab  (Dupixent )   Patient requested [name] at [facility] [place] date: 07/18/24   Medication will be filled on 8/22.   Patient is aware of $0 copayment.

## 2024-07-25 ENCOUNTER — Other Ambulatory Visit

## 2024-08-07 NOTE — Progress Notes (Deleted)
 [facility] OFFICE PROGRESS NOTE  Cyndi Shaver, PA-C No address on file  DIAGNOSIS: iron deficiency anemia secondary to menorrhagia.     PRIOR THERAPY: 1) IV iron with venofer PRN, most recent dose ~2022 in [place]     CURRENT THERAPY: Oral iron supplement with OTC ferrous sulfate every other day with vitamin C   INTERVAL HISTORY: Cheyenne Schwartz 28 y.o. female returnsto the clinic today for a follow-up visit. She was last seen by myself in June 2025 for a follow up.   She experiences fatigue and heavy menstrual bleeding, with her menstrual periods lasting about seven days and accompanied by clots. She has not been using birth control to manage her menstrual bleeding. She occasionally takes oral iron supplements, approximately once a week when she remembers. She incorporates iron-rich foods into her diet, such as spinach and ground beef.   I ordered iron but I don't see she got it. Must not have been able to reach her. *** she wanted to know about bulling.    She has cravings for ice chips, often associated with iron deficiency. No shortness of breath or abnormal bleeding or bruising aside from her menstrual cycle. Her recent blood work showed a hemoglobin level of 1***, indicating anemia. She has not had an iron infusion in a couple of years.   She works at [employer] in [place] and plans to attend PT school at [place] in six months. She has noticed her hands peeling, which she attributes to frequent use of hand sanitizer and possibly her eczema, although she usually does not experience this symptom.   She is here for evaluation and repeat blood work.    MEDICAL HISTORY: Past Medical History:  Diagnosis Date   Allergy    Nuts, fish, seafood, pollen, mold, apples   Anemia    iron   Anxiety    Asthma    Depression    Eczema    GERD (gastroesophageal reflux disease)    Palpitations    Ulcer 11/2018   Stomach ulcer    ALLERGIES:  is allergic to  chocolate flavoring agent (non-screening), cocoa, latex, other, peanut oil, shellfish allergy, apple, apple fruit extract, bee pollen, fish allergy, fish-derived products, peanut-containing drug products, pollen extract, short ragweed pollen ext, and banana.  MEDICATIONS:  Current Outpatient Medications  Medication Sig Dispense Refill   Ascorbic Acid (VITAMIN C GUMMIE PO) Take 2 tablets by mouth daily.     cetirizine (ZYRTEC) 10 MG tablet Take 10 mg by mouth daily as needed for allergies.     dupilumab  (DUPIXENT ) 300 MG/2ML prefilled syringe Inject 1 syringe into the skin every other week. (Patient not taking: Reported on 12/04/2023) 4 mL 11   EPINEPHrine 0.3 mg/0.3 mL IJ SOAJ injection      ferrous sulfate 325 (65 FE) MG tablet Take 325 mg by mouth daily with breakfast.     sertraline  (ZOLOFT ) 100 MG tablet Take 1 tablet (100 mg total) by mouth daily. 90 tablet 2   triamcinolone ointment (KENALOG) 0.1 % Apply 1 application topically 2 (two) times daily as needed (eczema). (Patient not taking: Reported on 12/04/2023)     No current facility-administered medications for this visit.    SURGICAL HISTORY:  Past Surgical History:  Procedure Laterality Date   IRRIGATION AND DEBRIDEMENT KNEE Left 11/14/2021   Procedure: IRRIGATION AND DEBRIDEMENT KNEE AND HARDWARE REMOVAL;  Surgeon: Yvone Rush, MD;  Location: [facility];  Service: Orthopedics;  Laterality: Left;   KNEE ARTHROSCOPY  Left    UPPER GI ENDOSCOPY     WISDOM TOOTH EXTRACTION      REVIEW OF SYSTEMS:   Review of Systems  Constitutional: Negative for appetite change, chills, fatigue, fever and unexpected weight change.  HENT:   Negative for mouth sores, nosebleeds, sore throat and trouble swallowing.   Eyes: Negative for eye problems and icterus.  Respiratory: Negative for cough, hemoptysis, shortness of breath and wheezing.   Cardiovascular: Negative for chest pain and leg swelling.  Gastrointestinal: Negative for abdominal pain,  constipation, diarrhea, nausea and vomiting.  Genitourinary: Negative for bladder incontinence, difficulty urinating, dysuria, frequency and hematuria.   Musculoskeletal: Negative for back pain, gait problem, neck pain and neck stiffness.  Skin: Negative for itching and rash.  Neurological: Negative for dizziness, extremity weakness, gait problem, headaches, light-headedness and seizures.  Hematological: Negative for adenopathy. Does not bruise/bleed easily.  Psychiatric/Behavioral: Negative for confusion, depression and sleep disturbance. The patient is not nervous/anxious.     PHYSICAL EXAMINATION:  There were no vitals taken for this visit.  ECOG PERFORMANCE STATUS: {CHL ONC ECOG [id number]  Physical Exam  Constitutional: Oriented to person, place, and time and well-developed, well-nourished, and in no distress. No distress.  HENT:  Head: Normocephalic and atraumatic.  Mouth/Throat: Oropharynx is clear and moist. No oropharyngeal exudate.  Eyes: Conjunctivae are normal. Right eye exhibits no discharge. Left eye exhibits no discharge. No scleral icterus.  Neck: Normal range of motion. Neck supple.  Cardiovascular: Normal rate, regular rhythm, normal heart sounds and intact distal pulses.   Pulmonary/Chest: Effort normal and breath sounds normal. No respiratory distress. No wheezes. No rales.  Abdominal: Soft. Bowel sounds are normal. Exhibits no distension and no mass. There is no tenderness.  Musculoskeletal: Normal range of motion. Exhibits no edema.  Lymphadenopathy:    No cervical adenopathy.  Neurological: Alert and oriented to person, place, and time. Exhibits normal muscle tone. Gait normal. Coordination normal.  Skin: Skin is warm and dry. No rash noted. Not diaphoretic. No erythema. No pallor.  Psychiatric: Mood, memory and judgment normal.  Vitals reviewed.  LABORATORY DATA: Lab Results  Component Value Date   WBC 5.8 05/12/2024   HGB 10.6 (L) 05/12/2024   HCT  33.0 (L) 05/12/2024   MCV 75.0 (L) 05/12/2024   PLT 374 05/12/2024      Chemistry      Component Value Date/Time   NA 136 03/21/2024 1431   K 4.1 03/21/2024 1431   CL 101 03/21/2024 1431   CO2 25 03/21/2024 1431   BUN 8 03/21/2024 1431   CREATININE 0.70 03/21/2024 1431      Component Value Date/Time   CALCIUM 9.3 03/21/2024 1431   ALKPHOS 71 07/07/2023 0820   AST 20 03/21/2024 1431   AST 13 (L) 07/07/2023 0820   ALT 12 03/21/2024 1431   ALT 15 07/07/2023 0820   BILITOT 0.5 03/21/2024 1431   BILITOT 0.3 07/07/2023 0820       RADIOGRAPHIC STUDIES:  No results found.   ASSESSMENT/PLAN:  This is a very pleasant 28 year old African-American female with iron deficiency anemia secondary to menorrhagia.  She was treated in the past with IV iron infusions with Venofer in 2022.  She is currently on over-the-counter iron supplements since March 2024.   At her last two appointment I recommended IV iron but it does not appear they were able to reach her. She had iron studies at her PCP's office in April which showed worsening IDA.    I  went ahead an ordered IV iron infusions. I discussed this with the patient. I ordered IV iron with venofer 200 mg weekly x5.    ***After a discussion she decided.      She continue with her oral iron supplement with vitamin C daily or every other day.    We will see her back for follow-up visit with repeat labs in 3 months.   The patient was advised to call immediately if she has any concerning symptoms in the interval. The patient voices understanding of current disease status and treatment options and is in agreement with the current care plan. All questions were answered. The patient knows to call the clinic with any problems, questions or concerns. We can certainly see the patient much sooner if necessary   No orders of the defined types were placed in this encounter.    I spent {CHL ONC TIME VISIT - DTPQU:[id number]} counseling the  patient face to face. The total time spent in the appointment was {CHL ONC TIME VISIT - DTPQU:[id number]}.  Manahil Vanzile L Andranik Jeune, PA-C 08/07/24

## 2024-08-12 ENCOUNTER — Ambulatory Visit: Admitting: Physician Assistant

## 2024-08-12 ENCOUNTER — Other Ambulatory Visit: Attending: Physician Assistant

## 2024-08-12 ENCOUNTER — Telehealth: Payer: Self-pay | Admitting: Physician Assistant

## 2024-08-12 NOTE — Telephone Encounter (Signed)
 Scheduled appointments per staff message. Called and left a VM with appointment details for the patient.

## 2024-08-13 NOTE — Progress Notes (Deleted)
 [facility] OFFICE PROGRESS NOTE  Cyndi Shaver, PA-C (Inactive) No address on file  DIAGNOSIS: iron deficiency anemia secondary to menorrhagia.   PRIOR THERAPY: 1) IV iron with venofer PRN, most recent dose ~2022 in [place]   CURRENT THERAPY:  Oral iron supplement with OTC ferrous sulfate every other day with vitamin C   INTERVAL HISTORY: Cheyenne Schwartz 28 y.o. female returns the clinic today for a follow-up visit. She was last seen by myself in June 2025 for a follow up.   She experiences fatigue and heavy menstrual bleeding, with her menstrual periods lasting about seven days and accompanied by clots. She has not been using birth control to manage her menstrual bleeding. She occasionally takes oral iron supplements, approximately once a week when she remembers. She incorporates iron-rich foods into her diet, such as spinach and ground beef.   I ordered iron but I don't see she got it. Must not have been able to reach her. *** she wanted to know about bulling.    She has cravings for ice chips, often associated with iron deficiency. No shortness of breath or abnormal bleeding or bruising aside from her menstrual cycle. Her recent blood work showed a hemoglobin level of 1***, indicating anemia. She has not had an iron infusion in a couple of years.   She works at [employer] in [place] and plans to attend PT school at [place] in six months. She has noticed her hands peeling, which she attributes to frequent use of hand sanitizer and possibly her eczema, although she usually does not experience this symptom.   She is here for evaluation and repeat blood work.   MEDICAL HISTORY: Past Medical History:  Diagnosis Date   Allergy    Nuts, fish, seafood, pollen, mold, apples   Anemia    iron   Anxiety    Asthma    Depression    Eczema    GERD (gastroesophageal reflux disease)    Palpitations    Ulcer 11/2018   Stomach ulcer    ALLERGIES:  is allergic to  chocolate flavoring agent (non-screening), cocoa, latex, other, peanut oil, shellfish allergy, apple, apple fruit extract, bee pollen, fish allergy, fish-derived products, peanut-containing drug products, pollen extract, short ragweed pollen ext, and banana.  MEDICATIONS:  Current Outpatient Medications  Medication Sig Dispense Refill   Ascorbic Acid (VITAMIN C GUMMIE PO) Take 2 tablets by mouth daily.     cetirizine (ZYRTEC) 10 MG tablet Take 10 mg by mouth daily as needed for allergies.     dupilumab  (DUPIXENT ) 300 MG/2ML prefilled syringe Inject 1 syringe into the skin every other week. (Patient not taking: Reported on 12/04/2023) 4 mL 11   EPINEPHrine 0.3 mg/0.3 mL IJ SOAJ injection      ferrous sulfate 325 (65 FE) MG tablet Take 325 mg by mouth daily with breakfast.     sertraline  (ZOLOFT ) 100 MG tablet Take 1 tablet (100 mg total) by mouth daily. 90 tablet 2   triamcinolone ointment (KENALOG) 0.1 % Apply 1 application topically 2 (two) times daily as needed (eczema). (Patient not taking: Reported on 12/04/2023)     No current facility-administered medications for this visit.    SURGICAL HISTORY:  Past Surgical History:  Procedure Laterality Date   IRRIGATION AND DEBRIDEMENT KNEE Left 11/14/2021   Procedure: IRRIGATION AND DEBRIDEMENT KNEE AND HARDWARE REMOVAL;  Surgeon: Yvone Rush, MD;  Location: [facility];  Service: Orthopedics;  Laterality: Left;   KNEE ARTHROSCOPY Left  UPPER GI ENDOSCOPY     WISDOM TOOTH EXTRACTION      REVIEW OF SYSTEMS:   Review of Systems  Constitutional: Negative for appetite change, chills, fatigue, fever and unexpected weight change.  HENT:   Negative for mouth sores, nosebleeds, sore throat and trouble swallowing.   Eyes: Negative for eye problems and icterus.  Respiratory: Negative for cough, hemoptysis, shortness of breath and wheezing.   Cardiovascular: Negative for chest pain and leg swelling.  Gastrointestinal: Negative for abdominal pain,  constipation, diarrhea, nausea and vomiting.  Genitourinary: Negative for bladder incontinence, difficulty urinating, dysuria, frequency and hematuria.   Musculoskeletal: Negative for back pain, gait problem, neck pain and neck stiffness.  Skin: Negative for itching and rash.  Neurological: Negative for dizziness, extremity weakness, gait problem, headaches, light-headedness and seizures.  Hematological: Negative for adenopathy. Does not bruise/bleed easily.  Psychiatric/Behavioral: Negative for confusion, depression and sleep disturbance. The patient is not nervous/anxious.     PHYSICAL EXAMINATION:  There were no vitals taken for this visit.  ECOG PERFORMANCE STATUS: {CHL ONC ECOG [id number]  Physical Exam  Constitutional: Oriented to person, place, and time and well-developed, well-nourished, and in no distress. No distress.  HENT:  Head: Normocephalic and atraumatic.  Mouth/Throat: Oropharynx is clear and moist. No oropharyngeal exudate.  Eyes: Conjunctivae are normal. Right eye exhibits no discharge. Left eye exhibits no discharge. No scleral icterus.  Neck: Normal range of motion. Neck supple.  Cardiovascular: Normal rate, regular rhythm, normal heart sounds and intact distal pulses.   Pulmonary/Chest: Effort normal and breath sounds normal. No respiratory distress. No wheezes. No rales.  Abdominal: Soft. Bowel sounds are normal. Exhibits no distension and no mass. There is no tenderness.  Musculoskeletal: Normal range of motion. Exhibits no edema.  Lymphadenopathy:    No cervical adenopathy.  Neurological: Alert and oriented to person, place, and time. Exhibits normal muscle tone. Gait normal. Coordination normal.  Skin: Skin is warm and dry. No rash noted. Not diaphoretic. No erythema. No pallor.  Psychiatric: Mood, memory and judgment normal.  Vitals reviewed.  LABORATORY DATA: Lab Results  Component Value Date   WBC 5.8 05/12/2024   HGB 10.6 (L) 05/12/2024   HCT  33.0 (L) 05/12/2024   MCV 75.0 (L) 05/12/2024   PLT 374 05/12/2024      Chemistry      Component Value Date/Time   NA 136 03/21/2024 1431   K 4.1 03/21/2024 1431   CL 101 03/21/2024 1431   CO2 25 03/21/2024 1431   BUN 8 03/21/2024 1431   CREATININE 0.70 03/21/2024 1431      Component Value Date/Time   CALCIUM 9.3 03/21/2024 1431   ALKPHOS 71 07/07/2023 0820   AST 20 03/21/2024 1431   AST 13 (L) 07/07/2023 0820   ALT 12 03/21/2024 1431   ALT 15 07/07/2023 0820   BILITOT 0.5 03/21/2024 1431   BILITOT 0.3 07/07/2023 0820       RADIOGRAPHIC STUDIES:  No results found.   ASSESSMENT/PLAN:  This is a very pleasant 28 year old African-American female with iron deficiency anemia secondary to menorrhagia.  She was treated in the past with IV iron infusions with Venofer in 2022.  She is currently on over-the-counter iron supplements since March 2024.   At her last two appointment I recommended IV iron but it does not appear they were able to reach her. She had iron studies at her PCP's office in April which showed worsening IDA.    I went ahead an ordered  IV iron infusions. I discussed this with the patient. I ordered IV iron with venofer 200 mg weekly x5.    ***After a discussion she decided.      She continue with her oral iron supplement with vitamin C daily or every other day.    We will see her back for follow-up visit with repeat labs in 3 months.   The patient was advised to call immediately if she has any concerning symptoms in the interval. The patient voices understanding of current disease status and treatment options and is in agreement with the current care plan.   No orders of the defined types were placed in this encounter.    I spent {CHL ONC TIME VISIT - DTPQU:[id number]} counseling the patient face to face. The total time spent in the appointment was {CHL ONC TIME VISIT - DTPQU:[id number]}.  Emilianna Barlowe L Dwayna Kentner, PA-C 08/13/24

## 2024-08-15 ENCOUNTER — Other Ambulatory Visit (HOSPITAL_COMMUNITY): Payer: Self-pay

## 2024-08-18 ENCOUNTER — Other Ambulatory Visit: Payer: Self-pay

## 2024-08-19 ENCOUNTER — Other Ambulatory Visit: Payer: Self-pay

## 2024-08-20 ENCOUNTER — Other Ambulatory Visit: Payer: Self-pay

## 2024-08-21 ENCOUNTER — Other Ambulatory Visit: Payer: Self-pay | Admitting: Pharmacy Technician

## 2024-08-21 ENCOUNTER — Other Ambulatory Visit: Payer: Self-pay

## 2024-08-21 NOTE — Progress Notes (Signed)
 Specialty Pharmacy Refill Coordination Note  Cheyenne Schwartz is a 28 y.o. female contacted today regarding refills of specialty medication(s) Dupilumab  (Dupixent )   Patient requested [name] at [facility] [place] date: 08/27/24   Medication will be filled on 08/27/24. Injection on 9/30

## 2024-08-21 NOTE — Progress Notes (Signed)
 Specialty Pharmacy Ongoing Clinical Assessment Note  Cheyenne Schwartz is a 28 y.o. female who is being followed by the specialty pharmacy service for RxSp Asthma/COPD   Patient's specialty medication(s) reviewed today: Dupilumab  (Dupixent )   Missed doses in the last 4 weeks: 0   Patient/Caregiver did not have any additional questions or concerns.   Therapeutic benefit summary: Patient is achieving benefit   Adverse events/side effects summary: Experienced adverse events/side effects (Patient reports some mild swelling at the injection site, I recommended a heating pad on the site prior to the injection, then alcohol swab, then inject, and follow with ice.  Patient reports that she will try this.)   Patient's therapy is appropriate to: Continue    Goals Addressed             This Visit's Progress    Maintain optimal adherence to therapy   On track    Patient is on track. Patient will maintain adherence         Follow up: 12 months  Silvano LOISE Dolly Specialty Pharmacist

## 2024-08-22 ENCOUNTER — Other Ambulatory Visit

## 2024-08-22 ENCOUNTER — Inpatient Hospital Stay: Admitting: Physician Assistant

## 2024-08-27 ENCOUNTER — Telehealth: Payer: Self-pay

## 2024-08-27 ENCOUNTER — Other Ambulatory Visit: Payer: Self-pay | Admitting: Physician Assistant

## 2024-08-27 ENCOUNTER — Telehealth: Payer: Self-pay | Admitting: Internal Medicine

## 2024-08-27 ENCOUNTER — Other Ambulatory Visit: Payer: Self-pay

## 2024-08-27 NOTE — Telephone Encounter (Signed)
 Spoke with patient regarding labs and IV iron orders. Patient stated she would like to have her labs drawn at [facility] due to high cost at the cancer center with her insurance. Orders were placed, and a requisition was printed. Patient will pick up the printed lab orders tomorrow morning at the cancer center front desk and will have labs drawn at [facility] one week prior to her appointment with Cassie, PA, on 10/03/24. Cassie, PA, placed orders for IV iron at [facility]. Patient was informed she may receive IV iron prior to her visit with Cassie, PA, on 10/03/24. Patient voiced understanding.

## 2024-08-27 NOTE — Telephone Encounter (Signed)
 Rescheduled appointments with the patient per staff message. The patient is aware of the appointment details and is active on MyChart. The patient also asked about re-opening her referral for Iron at [facility] infusion. She was transferred to the nurse.

## 2024-09-05 ENCOUNTER — Other Ambulatory Visit: Payer: Self-pay | Admitting: Medical Genetics

## 2024-09-05 DIAGNOSIS — Z006 Encounter for examination for normal comparison and control in clinical research program: Secondary | ICD-10-CM

## 2024-09-22 ENCOUNTER — Other Ambulatory Visit: Payer: Self-pay

## 2024-09-24 ENCOUNTER — Other Ambulatory Visit (HOSPITAL_COMMUNITY): Payer: Self-pay

## 2024-09-26 ENCOUNTER — Other Ambulatory Visit (HOSPITAL_COMMUNITY): Payer: Self-pay

## 2024-09-27 NOTE — Progress Notes (Deleted)
 [facility] OFFICE PROGRESS NOTE  Cyndi Shaver, PA-C (Inactive) No address on file  DIAGNOSIS: Iron deficiency anemia secondary to menorrhagia.   PRIOR THERAPY: 1) IV iron with venofer PRN, most recent dose ~2022 in [place]   CURRENT THERAPY: Oral iron supplement with OTC ferrous sulfate every other day with vitamin C   INTERVAL HISTORY: Cheyenne Schwartz 28 y.o. female returns to the clinic today for a follow-up visit. She was last seen by myself in June 2025. I ordered IV iron at that time but she was concerned about the out of pocket cost. Therefore, she did not get her IV iron.   She also had her labs performed at [facility] due to the cost.   She experiences fatigue and heavy menstrual bleeding, with her menstrual periods lasting about seven days and accompanied by clots. She has not been using birth control to manage her menstrual bleeding. She occasionally takes oral iron supplements, approximately once a week when she remembers. She incorporates iron-rich foods into her diet, such as spinach and ground beef.   She has cravings for ice chips, a symptom known as pica, often associated with iron deficiency. No shortness of breath or abnormal bleeding or bruising aside from her menstrual cycle. Her recent blood work showed a hemoglobin level of ***, indicating anemia. She has not had an iron infusion in a couple of years.   She is here for evaluation and repeat blood work.   MEDICAL HISTORY: Past Medical History:  Diagnosis Date   Allergy    Nuts, fish, seafood, pollen, mold, apples   Anemia    iron   Anxiety    Asthma    Depression    Eczema    GERD (gastroesophageal reflux disease)    Palpitations    Ulcer 11/2018   Stomach ulcer    ALLERGIES:  is allergic to chocolate flavoring agent (non-screening), cocoa, latex, other, peanut oil, shellfish allergy, apple, apple fruit extract, bee pollen, fish allergy, fish protein-containing drug products, peanut-containing  drug products, pollen extract, short ragweed pollen ext, and banana.  MEDICATIONS:  Current Outpatient Medications  Medication Sig Dispense Refill   Ascorbic Acid (VITAMIN C GUMMIE PO) Take 2 tablets by mouth daily.     cetirizine (ZYRTEC) 10 MG tablet Take 10 mg by mouth daily as needed for allergies.     dupilumab  (DUPIXENT ) 300 MG/2ML prefilled syringe Inject 1 syringe into the skin every other week. (Patient not taking: Reported on 12/04/2023) 4 mL 11   EPINEPHrine  0.3 mg/0.3 mL IJ SOAJ injection      ferrous sulfate 325 (65 FE) MG tablet Take 325 mg by mouth daily with breakfast.     sertraline  (ZOLOFT ) 100 MG tablet Take 1 tablet (100 mg total) by mouth daily. 90 tablet 2   triamcinolone ointment (KENALOG) 0.1 % Apply 1 application topically 2 (two) times daily as needed (eczema). (Patient not taking: Reported on 12/04/2023)     No current facility-administered medications for this visit.    SURGICAL HISTORY:  Past Surgical History:  Procedure Laterality Date   IRRIGATION AND DEBRIDEMENT KNEE Left 11/14/2021   Procedure: IRRIGATION AND DEBRIDEMENT KNEE AND HARDWARE REMOVAL;  Surgeon: Yvone Rush, MD;  Location: [facility];  Service: Orthopedics;  Laterality: Left;   KNEE ARTHROSCOPY Left    UPPER GI ENDOSCOPY     WISDOM TOOTH EXTRACTION      REVIEW OF SYSTEMS:   Review of Systems  Constitutional: Negative for appetite change, chills, fatigue, fever and  unexpected weight change.  HENT:   Negative for mouth sores, nosebleeds, sore throat and trouble swallowing.   Eyes: Negative for eye problems and icterus.  Respiratory: Negative for cough, hemoptysis, shortness of breath and wheezing.   Cardiovascular: Negative for chest pain and leg swelling.  Gastrointestinal: Negative for abdominal pain, constipation, diarrhea, nausea and vomiting.  Genitourinary: Negative for bladder incontinence, difficulty urinating, dysuria, frequency and hematuria.   Musculoskeletal: Negative for back pain,  gait problem, neck pain and neck stiffness.  Skin: Negative for itching and rash.  Neurological: Negative for dizziness, extremity weakness, gait problem, headaches, light-headedness and seizures.  Hematological: Negative for adenopathy. Does not bruise/bleed easily.  Psychiatric/Behavioral: Negative for confusion, depression and sleep disturbance. The patient is not nervous/anxious.     PHYSICAL EXAMINATION:  There were no vitals taken for this visit.  ECOG PERFORMANCE STATUS: {CHL ONC ECOG [id number]  Physical Exam  Constitutional: Oriented to person, place, and time and well-developed, well-nourished, and in no distress. No distress.  HENT:  Head: Normocephalic and atraumatic.  Mouth/Throat: Oropharynx is clear and moist. No oropharyngeal exudate.  Eyes: Conjunctivae are normal. Right eye exhibits no discharge. Left eye exhibits no discharge. No scleral icterus.  Neck: Normal range of motion. Neck supple.  Cardiovascular: Normal rate, regular rhythm, normal heart sounds and intact distal pulses.   Pulmonary/Chest: Effort normal and breath sounds normal. No respiratory distress. No wheezes. No rales.  Abdominal: Soft. Bowel sounds are normal. Exhibits no distension and no mass. There is no tenderness.  Musculoskeletal: Normal range of motion. Exhibits no edema.  Lymphadenopathy:    No cervical adenopathy.  Neurological: Alert and oriented to person, place, and time. Exhibits normal muscle tone. Gait normal. Coordination normal.  Skin: Skin is warm and dry. No rash noted. Not diaphoretic. No erythema. No pallor.  Psychiatric: Mood, memory and judgment normal.  Vitals reviewed.  LABORATORY DATA: Lab Results  Component Value Date   WBC 5.8 05/12/2024   HGB 10.6 (L) 05/12/2024   HCT 33.0 (L) 05/12/2024   MCV 75.0 (L) 05/12/2024   PLT 374 05/12/2024      Chemistry      Component Value Date/Time   NA 136 03/21/2024 1431   K 4.1 03/21/2024 1431   CL 101 03/21/2024 1431    CO2 25 03/21/2024 1431   BUN 8 03/21/2024 1431   CREATININE 0.70 03/21/2024 1431      Component Value Date/Time   CALCIUM 9.3 03/21/2024 1431   ALKPHOS 71 07/07/2023 0820   AST 20 03/21/2024 1431   AST 13 (L) 07/07/2023 0820   ALT 12 03/21/2024 1431   ALT 15 07/07/2023 0820   BILITOT 0.5 03/21/2024 1431   BILITOT 0.3 07/07/2023 0820       RADIOGRAPHIC STUDIES:  No results found.   ASSESSMENT/PLAN:  This is a very pleasant 28 year old African-American female with iron deficiency anemia secondary to menorrhagia.  She was treated in the past with IV iron infusions with Venofer in 2022.  She is currently on over-the-counter iron supplements since March 2024.   At her last two appointments I recommended IV iron but she was concerned this would be cost prohibitive.    ***She is not taking any iron supplements due to ***.   Labs were reviewed which shows ***.      She continue with her oral iron supplement with vitamin C daily or every other day.    We will see her back for follow-up visit with repeat labs in 3  months.  The patient was advised to call immediately if she has any concerning symptoms in the interval. The patient voices understanding of current disease status and treatment options and is in agreement with the current care plan. All questions were answered. The patient knows to call the clinic with any problems, questions or concerns. We can certainly see the patient much sooner if necessary  No orders of the defined types were placed in this encounter.    I spent {CHL ONC TIME VISIT - DTPQU:[id number]} counseling the patient face to face. The total time spent in the appointment was {CHL ONC TIME VISIT - DTPQU:[id number]}.  Anthonia Monger L Indea Dearman, PA-C 09/27/24

## 2024-10-03 ENCOUNTER — Other Ambulatory Visit

## 2024-10-03 ENCOUNTER — Inpatient Hospital Stay: Admitting: Physician Assistant

## 2024-10-03 DIAGNOSIS — H5213 Myopia, bilateral: Secondary | ICD-10-CM | POA: Diagnosis not present

## 2024-10-03 DIAGNOSIS — Z135 Encounter for screening for eye and ear disorders: Secondary | ICD-10-CM | POA: Diagnosis not present

## 2024-10-14 ENCOUNTER — Ambulatory Visit: Admitting: Family Medicine

## 2024-10-14 ENCOUNTER — Encounter: Payer: Self-pay | Admitting: Family Medicine

## 2024-10-14 ENCOUNTER — Other Ambulatory Visit (HOSPITAL_BASED_OUTPATIENT_CLINIC_OR_DEPARTMENT_OTHER): Payer: Self-pay

## 2024-10-14 ENCOUNTER — Other Ambulatory Visit: Payer: Self-pay

## 2024-10-14 VITALS — BP 114/76 | HR 94 | Ht 65.0 in | Wt 214.0 lb

## 2024-10-14 DIAGNOSIS — E782 Mixed hyperlipidemia: Secondary | ICD-10-CM | POA: Diagnosis not present

## 2024-10-14 DIAGNOSIS — D509 Iron deficiency anemia, unspecified: Secondary | ICD-10-CM | POA: Diagnosis not present

## 2024-10-14 DIAGNOSIS — Z0184 Encounter for antibody response examination: Secondary | ICD-10-CM | POA: Diagnosis not present

## 2024-10-14 DIAGNOSIS — L309 Dermatitis, unspecified: Secondary | ICD-10-CM | POA: Insufficient documentation

## 2024-10-14 DIAGNOSIS — Z9109 Other allergy status, other than to drugs and biological substances: Secondary | ICD-10-CM | POA: Diagnosis not present

## 2024-10-14 DIAGNOSIS — F332 Major depressive disorder, recurrent severe without psychotic features: Secondary | ICD-10-CM

## 2024-10-14 DIAGNOSIS — J4520 Mild intermittent asthma, uncomplicated: Secondary | ICD-10-CM

## 2024-10-14 DIAGNOSIS — Z0000 Encounter for general adult medical examination without abnormal findings: Secondary | ICD-10-CM

## 2024-10-14 LAB — LIPID PANEL
Cholesterol: 232 mg/dL — ABNORMAL HIGH (ref 0–200)
HDL: 58.9 mg/dL (ref >39.00)
LDL Cholesterol: 150 mg/dL — ABNORMAL HIGH (ref 0–99)
NonHDL: 172.72
Total CHOL/HDL Ratio: 4
Triglycerides: 114 mg/dL (ref 0.0–149.0)
VLDL: 22.8 mg/dL (ref 0.0–40.0)

## 2024-10-14 MED ORDER — EPINEPHRINE 0.3 MG/0.3ML IJ SOAJ
0.3000 mg | INTRAMUSCULAR | 0 refills | Status: AC | PRN
  Filled 2024-10-14: qty 2, 30d supply, fill #0

## 2024-10-14 MED ORDER — ALBUTEROL SULFATE HFA 108 (90 BASE) MCG/ACT IN AERS
1.0000 | INHALATION_SPRAY | Freq: Four times a day (QID) | RESPIRATORY_TRACT | 1 refills | Status: AC | PRN
  Filled 2024-10-14: qty 6.7, 25d supply, fill #0

## 2024-10-14 MED ORDER — SERTRALINE HCL 100 MG PO TABS
150.0000 mg | ORAL_TABLET | Freq: Every day | ORAL | 1 refills | Status: AC
  Filled 2024-10-14: qty 135, 90d supply, fill #0

## 2024-10-14 NOTE — Assessment & Plan Note (Signed)
 Managed with iron supplementation. Non-compliance noted. Previous infusions beneficial. - Schedule labs with hematology group.

## 2024-10-14 NOTE — Assessment & Plan Note (Signed)
 Well-controlled with Dupixent . Follows with allergist in [place].  - Continue Dupixent 

## 2024-10-14 NOTE — Progress Notes (Signed)
 New Patient Office Visit   Subjective     Patient ID: Cheyenne Schwartz, female   DOB: 1996/05/09  Age: 28 y.o. MRN: [id number]   CC:  Chief Complaint  Patient presents with   Establish Care      HPI [name] presents to establish/transfer care. She lives alone and is about to start DPT school at [employer].     Anxiety and Depression: - Treatment: Zoloft   - Medication side effects: none - SI/HI: no - Update: Doing fine overall, may need a slight increase. Participates in counseling.   Iron deficiency anemia: - Follows with [facility] hematology - Management: ferrous sulfate 325 mg; has needed infusions in the past - Compliance: inconsistent  - Symptoms: stable  Asthma, environmental allergies, eczema: - She follows annually with her allergist in [place] - Management: Zyrtec 10 mg daily, albuterol  PRN, Dupixant - Compliance: good - Symptoms: well controlled       10/14/2024    8:28 AM 07/18/2024    1:48 PM 03/21/2024    2:33 PM  PHQ9 SCORE ONLY  PHQ-9 Total Score 15 1 10       Data saved with a previous flowsheet row definition      10/14/2024    8:28 AM 03/21/2024    2:33 PM  GAD 7 : Generalized Anxiety Score  Nervous, Anxious, on Edge 2 1  Control/stop worrying 2 1  Worry too much - different things 2 1  Trouble relaxing 1 1  Restless 1 0  Easily annoyed or irritable 1 1  Afraid - awful might happen 1 1  Total GAD 7 Score 10 6  Anxiety Difficulty Somewhat difficult Somewhat difficult       Outpatient Medications Prior to Visit  Medication Sig   Ascorbic Acid (VITAMIN C GUMMIE PO) Take 2 tablets by mouth daily.   cetirizine (ZYRTEC) 10 MG tablet Take 10 mg by mouth daily as needed for allergies.   dupilumab  (DUPIXENT ) 300 MG/2ML prefilled syringe Inject 1 syringe into the skin every other week.   ferrous sulfate 325 (65 FE) MG tablet Take 325 mg by mouth daily with breakfast.   hydrOXYzine  (ATARAX ) 25 MG tablet Take 25 mg by mouth 3 (three) times  daily.   montelukast  (SINGULAIR ) 10 MG tablet Take 10 mg by mouth daily.   triamcinolone ointment (KENALOG) 0.1 % Apply 1 application topically 2 (two) times daily as needed (eczema).   [DISCONTINUED] albuterol  (VENTOLIN  HFA) 108 (90 Base) MCG/ACT inhaler Inhale 1-2 puffs into the lungs every 6 (six) hours as needed.   [DISCONTINUED] EPINEPHrine 0.3 mg/0.3 mL IJ SOAJ injection    [DISCONTINUED] sertraline  (ZOLOFT ) 100 MG tablet Take 1 tablet (100 mg total) by mouth daily.   No facility-administered medications prior to visit.   Past Medical History:  Diagnosis Date   Allergy    Nuts, fish, seafood, pollen, mold, apples   Anemia    iron   Anxiety    Asthma    Depression    Eczema    GERD (gastroesophageal reflux disease)    Palpitations    Ulcer 11/2018   Stomach ulcer    Past Surgical History:  Procedure Laterality Date   IRRIGATION AND DEBRIDEMENT KNEE Left 11/14/2021   Procedure: IRRIGATION AND DEBRIDEMENT KNEE AND HARDWARE REMOVAL;  Surgeon: Yvone Rush, MD;  Location: [facility];  Service: Orthopedics;  Laterality: Left;   KNEE ARTHROSCOPY Left    UPPER GI ENDOSCOPY     WISDOM TOOTH EXTRACTION  Family History  Problem Relation Age of Onset   Hypertension Father    Arthritis Father    Diabetes Mother    Epilepsy Sister    Myasthenia gravis Sister     Social History   Socioeconomic History   Marital status: Single    Spouse name: Not on file   Number of children: Not on file   Years of education: Not on file   Highest education level: Bachelor's degree (e.g., BA, AB, BS)  Occupational History   Not on file  Tobacco Use   Smoking status: Never   Smokeless tobacco: Never  Vaping Use   Vaping status: Never Used  Substance and Sexual Activity   Alcohol use: Yes    Alcohol/week: 2.0 standard drinks of alcohol    Types: 2 Standard drinks or equivalent per week    Comment: occassional   Drug use: Never   Sexual activity: Yes    Birth control/protection:  None  Other Topics Concern   Not on file  Social History Narrative   Not on file   Social Drivers of Health   Financial Resource Strain: Low Risk  (10/11/2024)   Overall Financial Resource Strain (CARDIA)    Difficulty of Paying Living Expenses: Not very hard  Food Insecurity: No Food Insecurity (10/11/2024)   Hunger Vital Sign    Worried About Running Out of Food in the Last Year: Never true    Ran Out of Food in the Last Year: Never true  Transportation Needs: No Transportation Needs (10/11/2024)   PRAPARE - Administrator, Civil Service (Medical): No    Lack of Transportation (Non-Medical): No  Physical Activity: Sufficiently Active (10/11/2024)   Exercise Vital Sign    Days of Exercise per Week: 5 days    Minutes of Exercise per Session: 30 min  Stress: Stress Concern Present (10/11/2024)   [employer] of Occupational Health - Occupational Stress Questionnaire    Feeling of Stress: Very much  Social Connections: Moderately Integrated (10/11/2024)   Social Connection and Isolation Panel    Frequency of Communication with Friends and Family: More than three times a week    Frequency of Social Gatherings with Friends and Family: Once a week    Attends Religious Services: More than 4 times per year    Active Member of [employer] or Organizations: Yes    Attends Engineer, Structural: More than 4 times per year    Marital Status: Never married       ROS All review of systems negative except what is listed in the HPI    Objective     BP 114/76   Pulse 94   Ht 5' 5 (1.651 m)   Wt 214 lb (97.1 kg)   SpO2 100%   BMI 35.61 kg/m    Physical Exam Vitals reviewed.  Constitutional:      Appearance: Normal appearance. She is obese.  Cardiovascular:     Rate and Rhythm: Normal rate and regular rhythm.     Heart sounds: Normal heart sounds.  Pulmonary:     Effort: Pulmonary effort is normal.     Breath sounds: Normal breath sounds.  Skin:     General: Skin is warm and dry.  Neurological:     Mental Status: She is alert and oriented to person, place, and time.  Psychiatric:        Mood and Affect: Mood normal.        Behavior: Behavior normal.  Thought Content: Thought content normal.        Judgment: Judgment normal.        Assessment & Plan:     Problem List Items Addressed This Visit       Active Problems   Severe recurrent major depression without psychotic features (HCC) - Primary   Managed with Zoloft . She would like to try slight dose increase PHQ9/GAD7 reviewed. No SI/HI.  - Increased Zoloft  to 150 mg. - Scheduled follow-up video visit in six weeks to assess response to dose increase. - Continue counseling and lifestyle measures.      Relevant Medications   hydrOXYzine  (ATARAX ) 25 MG tablet   sertraline  (ZOLOFT ) 100 MG tablet   Iron deficiency anemia   Managed with iron supplementation. Non-compliance noted. Previous infusions beneficial. - Schedule labs with hematology group.      Hyperlipidemia, mixed   Previously high cholesterol. - Ordered cholesterol level recheck.      Relevant Medications   EPINEPHrine  0.3 mg/0.3 mL IJ SOAJ injection   Other Relevant Orders   Lipid panel   Asthma   Well-controlled with albuterol  as needed. - Continue albuterol  as needed.      Relevant Medications   montelukast  (SINGULAIR ) 10 MG tablet   albuterol  (VENTOLIN  HFA) 108 (90 Base) MCG/ACT inhaler   Eczema   Well-controlled with Dupixent . Follows with allergist in [place].  - Continue Dupixent        Multiple environmental allergies   Stable on Zyrtec.       Relevant Medications   EPINEPHrine  0.3 mg/0.3 mL IJ SOAJ injection   albuterol  (VENTOLIN  HFA) 108 (90 Base) MCG/ACT inhaler   Other Visit Diagnoses       Encounter for medical examination to establish care         Immunity status testing       Relevant Orders   QuantiFERON-TB Gold Plus   Hepatitis B surface antibody,quantitative            Return in about 6 weeks (around 11/25/2024) for mood follow-up.  Waddell KATHEE Mon, NP  I,Emily Lagle,acting as a scribe for Waddell KATHEE Mon, NP.,have documented all relevant documentation on the behalf of Waddell KATHEE Mon, NP.  I, Waddell KATHEE Mon, NP, have reviewed all documentation for this visit. The documentation on 10/14/2024 for the exam, diagnosis, procedures, and orders are all accurate and complete.

## 2024-10-14 NOTE — Assessment & Plan Note (Signed)
 Previously high cholesterol. - Ordered cholesterol level recheck.

## 2024-10-14 NOTE — Assessment & Plan Note (Signed)
 Managed with Zoloft . She would like to try slight dose increase PHQ9/GAD7 reviewed. No SI/HI.  - Increased Zoloft  to 150 mg. - Scheduled follow-up video visit in six weeks to assess response to dose increase. - Continue counseling and lifestyle measures.

## 2024-10-14 NOTE — Assessment & Plan Note (Signed)
 Well controlled with albuterol  as needed.   -Continue albuterol  as needed.

## 2024-10-14 NOTE — Assessment & Plan Note (Signed)
Stable on Zyrtec

## 2024-10-16 ENCOUNTER — Ambulatory Visit: Payer: Self-pay | Admitting: Family Medicine

## 2024-10-16 LAB — QUANTIFERON-TB GOLD PLUS
Mitogen-NIL: 3.67 [IU]/mL
NIL: 0.02 [IU]/mL
QuantiFERON-TB Gold Plus: NEGATIVE
TB1-NIL: 0 [IU]/mL
TB2-NIL: 0 [IU]/mL

## 2024-10-16 LAB — HEPATITIS B SURFACE ANTIBODY, QUANTITATIVE: Hep B S AB Quant (Post): 80 m[IU]/mL (ref >=10)

## 2024-11-05 ENCOUNTER — Telehealth: Payer: Self-pay | Admitting: Pharmacist

## 2024-11-05 NOTE — Telephone Encounter (Signed)
 Called patient to schedule an appointment for the [facility]. I was unable to reach the patient so I left a HIPAA-compliant message requesting that the patient return my call.   Herlene Fleeta Morris, PharmD, JAQUELINE, CPP Clinical Pharmacist [facility] [phone]

## 2024-11-23 NOTE — Progress Notes (Signed)
 Virtual Video Visit via MyChart Note  I connected with  Cheyenne Schwartz on 11/25/2024 at  9:00 AM EST by the video enabled telemedicine application for MyChart, and verified that I am speaking with the correct person using two identifiers.   I introduced myself as a Publishing Rights Manager with the practice. We discussed the limitations of evaluation and management by telemedicine and the availability of in person appointments. The patient expressed understanding and agreed to proceed.  Participating parties in this visit include: The patient and the nurse practitioner listed.  The patient is: At home I am: In the office - [facility]  Subjective:    CC: No chief complaint on file.   HPI: Cheyenne Schwartz is a 28 y.o. year old female presenting today via MyChart today for 6-week mood follow up.   Mood follow-up: - Diagnosis: Anxiety/Depression - Treatment: Zoloft  150 mg daily.  - Medication side effects: *** - SI/HI: *** - Update: ***     10/14/2024    8:28 AM 07/18/2024    1:48 PM 03/21/2024    2:33 PM 06/19/2023    9:05 AM 04/07/2022   11:46 AM  Depression screen PHQ 2/9  Decreased Interest 2 0 1 0 0  Down, Depressed, Hopeless 2 1 2  0 0  PHQ - 2 Score 4 1 3  0 0  Altered sleeping 2  1    Tired, decreased energy 2  2    Change in appetite 1  1    Feeling bad or failure about yourself  1  1    Trouble concentrating 2  1    Moving slowly or fidgety/restless 1  0    Suicidal thoughts 2  1    PHQ-9 Score 15  10     Difficult doing work/chores Somewhat difficult  Somewhat difficult       Data saved with a previous flowsheet row definition      10/14/2024    8:28 AM 03/21/2024    2:33 PM  GAD 7 : Generalized Anxiety Score  Nervous, Anxious, on Edge 2 1  Control/stop worrying 2 1  Worry too much - different things 2 1  Trouble relaxing 1 1  Restless 1 0  Easily annoyed or irritable 1 1  Afraid - awful might happen 1 1  Total GAD 7 Score 10 6   Anxiety Difficulty Somewhat difficult Somewhat difficult         Past medical history, Surgical history, Family history not pertinant except as noted below, Social history, Allergies, and medications have been entered into the medical record, reviewed, and corrections made.   Review of Systems:  All review of systems negative except what is listed in the HPI   Objective:    General:  Speaking clearly in complete sentences. {(BH) RANGE ABSENT/SEVERE:20013} shortness of breath noted.   Alert and oriented x3.   Normal judgment.  {ABSENTORPRESENT:304960286} acute distress.   Impression and Recommendations:    Problem List Items Addressed This Visit   None      Follow-up if symptoms worsen or fail to improve.    I discussed the assessment and treatment plan with the patient. The patient was provided an opportunity to ask questions and all were answered. The patient agreed with the plan and demonstrated an understanding of the instructions.   The patient was advised to call back or seek an in-person evaluation if the symptoms worsen or if the condition fails to improve as anticipated.  Waddell KATHEE Mon, NP  I,Emily Lagle,acting as a scribe for Waddell KATHEE Mon, NP.,have documented all relevant documentation on the behalf of Waddell KATHEE Mon, NP.  I, Waddell KATHEE Mon, NP, have reviewed all documentation for this visit. The documentation on 11/25/2024 for the exam, diagnosis, procedures, and orders are all accurate and complete.

## 2024-11-25 ENCOUNTER — Encounter: Payer: Self-pay | Admitting: Family Medicine

## 2024-11-25 ENCOUNTER — Telehealth: Admitting: Family Medicine

## 2024-11-25 DIAGNOSIS — F332 Major depressive disorder, recurrent severe without psychotic features: Secondary | ICD-10-CM

## 2024-11-25 NOTE — Assessment & Plan Note (Signed)
 Doing good since increasing to Zoloft  150 mg daily. No SI/HI. PHQ9/GAD7 reviewed.  Continue current dose and lifestyle measures.

## 2024-12-18 ENCOUNTER — Encounter: Payer: Self-pay | Admitting: Physician Assistant

## 2024-12-19 ENCOUNTER — Encounter: Payer: Self-pay | Admitting: Emergency Medicine

## 2024-12-19 ENCOUNTER — Ambulatory Visit: Admitting: Emergency Medicine

## 2024-12-19 VITALS — BP 124/80 | HR 109 | Temp 97.6°F | Ht 65.0 in | Wt 218.0 lb

## 2024-12-19 DIAGNOSIS — K219 Gastro-esophageal reflux disease without esophagitis: Secondary | ICD-10-CM

## 2024-12-19 NOTE — Patient Instructions (Addendum)
 Pepcid (famotidine) [facility] tablets [facility] for about $6 Take 1 tablet, twice daily  I recommend using for 2 weeks in a row and seeing how your symptoms improve  If you start to get any abdominal pain, or pain after eating, let me know and we can try the Pantoprazole  too.  Increase fluids as much as tolerated.  Bland diet for the next 2 weeks.  Avoid fatty, greasy, citrusy, spicy, caffeine and alcohol as these can worsen symptoms.  Do not use any ibuprofen/Advil or other NSAIDs as these can worsen symptoms. Tylenol  is safe to use.  If at any point your symptoms worsen, including severe pain or inability to tolerate fluids, please go directly to the emergency department.

## 2024-12-19 NOTE — Progress Notes (Signed)
 " [facility]     Chief Complaint   Chief Complaint  Patient presents with   Acute Visit    Acid Reflux     Patient ID: Cheyenne Schwartz, female    DOB: 23-Dec-1995  Age: 29 y.o. MRN: [id number]  Here with reflux/regurgitation, happening intermittently for months Some burping  2 nights ago woke up with reflux and felt choking, throat burning. Self resolved after a few seconds.  She tries to eat 3 hours before bed to avoid symptoms.  Avoids citrus, trying to have less spicy foods  Not currently having any abdominal pain, nausea, vomiting. Normal BM's, last was yesterday  Takes tums every now and then   Has taken pantoprazole  in the past for likely peptic ulcer  Maybe 2020 (?) Had endoscopy at this time, may have had narrow esophagus  Reports family history of esophageal issues   Past Medical History    Past Medical History:  Diagnosis Date   Allergy    Nuts, fish, seafood, pollen, mold, apples   Anemia    iron   Anxiety    Asthma    Depression    Eczema    GERD (gastroesophageal reflux disease)    Palpitations    Ulcer 11/2018   Stomach ulcer     Family History  Family History  Problem Relation Age of Onset   Hypertension Father    Arthritis Father    Diabetes Mother    Epilepsy Sister    Myasthenia gravis Sister      Social History   Social History[1]   Home Medications    Medications Ordered Prior to Encounter[2]   Allergies    Allergies[3]   Review of Systems  ROS  As per HPI  Physical Exam  Vital Signs  Vitals:   12/19/24 0938  BP: 124/80  Pulse: (!) 109  Temp: 97.6 F (36.4 C)  SpO2: 99%     Physical Exam Vitals and nursing note reviewed.  Constitutional:      Appearance: Normal appearance. She is not ill-appearing.  HENT:     Mouth/Throat:     Mouth: Mucous membranes are moist.     Pharynx: Oropharynx is clear.  Eyes:     Conjunctiva/sclera: Conjunctivae normal.  Cardiovascular:     Rate and Rhythm: Normal  rate and regular rhythm.     Heart sounds: Normal heart sounds.  Pulmonary:     Effort: Pulmonary effort is normal.     Breath sounds: Normal breath sounds.  Abdominal:     General: Bowel sounds are normal.     Palpations: Abdomen is soft.     Tenderness: There is no abdominal tenderness. There is no right CVA tenderness, left CVA tenderness, guarding or rebound.  Musculoskeletal:        General: Normal range of motion.  Skin:    General: Skin is warm and dry.  Neurological:     Mental Status: She is alert and oriented to person, place, and time.      Treatments / Results  Labs  Lab Orders  No laboratory test(s) ordered today     No results found for this or any previous visit (from the past 24 hours).   No orders of the defined types were placed in this encounter.    Assessment and Plan  Pertinent lab results that were available during my care of the patient were reviewed by me and considered in my medical decision making (see chart for details).  Afebrile,  well-appearing, non tender abdomen Symptoms consistent with GERD. Less likely ulcer given no pain. Recommend pepcid BID, bland diet, and monitoring of symptoms. Discussion with patient about other supportive care (foods/meds to avoid, sitting upright after eating, propping up at bedtime, etc). If she develops abdominal discomfort or burning, we can trial Protonix . Understand strict ED precautions for severe pain or inability to tolerate fluids. Patient is agreeable with plan, questions answered. Can call/message with any concerns.    Asberry Searle, PA [facility]     [1]  Social History Tobacco Use   Smoking status: Never   Smokeless tobacco: Never  Vaping Use   Vaping status: Never Used  Substance Use Topics   Alcohol use: Yes    Alcohol/week: 2.0 standard drinks of alcohol    Types: 2 Standard drinks or equivalent per week    Comment: occassional   Drug use: Never  [2]  Current  Outpatient Medications on File Prior to Visit  Medication Sig Dispense Refill   cetirizine (ZYRTEC) 10 MG tablet Take 10 mg by mouth daily as needed for allergies.     EPINEPHrine  0.3 mg/0.3 mL IJ SOAJ injection Inject 0.3 mg into the muscle as needed for anaphylaxis. 2 each 0   ferrous sulfate 325 (65 FE) MG tablet Take 325 mg by mouth daily with breakfast.     montelukast  (SINGULAIR ) 10 MG tablet Take 10 mg by mouth daily.     sertraline  (ZOLOFT ) 100 MG tablet Take 1.5 tablets (150 mg total) by mouth daily. 135 tablet 1   albuterol  (VENTOLIN  HFA) 108 (90 Base) MCG/ACT inhaler Inhale 1-2 puffs into the lungs every 6 (six) hours as needed. (Patient not taking: Reported on 12/19/2024) 6.7 g 1   Ascorbic Acid (VITAMIN C GUMMIE PO) Take 2 tablets by mouth daily. (Patient not taking: Reported on 12/19/2024)     dupilumab  (DUPIXENT ) 300 MG/2ML prefilled syringe Inject 1 syringe into the skin every other week. (Patient not taking: Reported on 12/19/2024) 4 mL 11   hydrOXYzine  (ATARAX ) 25 MG tablet Take 25 mg by mouth 3 (three) times daily. (Patient not taking: Reported on 12/19/2024)     triamcinolone ointment (KENALOG) 0.1 % Apply 1 application topically 2 (two) times daily as needed (eczema).     No current facility-administered medications on file prior to visit.  [3]  Allergies Allergen Reactions   Chocolate Flavoring Agent (Non-Screening) Hives and Itching   Cocoa Hives   Latex Hives and Itching   Other Hives, Itching and Other (See Comments)    runny nose and Itchy throat   Peanut Oil Itching    Itchy Throat  Allergic to most nuts per patient  Itchy Throat Allergic to most nuts per patient     Itchy Throat   Shellfish Allergy Itching    Itchy throat   Apple Itching   Apple Fruit Extract Itching   Bee Pollen Other (See Comments) and Hives    Sneezing; runny nose   Fish Allergy Itching   Fish Protein-Containing Drug Products Other (See Comments) and Itching   Flavoring Agent Itching    Peanut-Containing Drug Products Other (See Comments)   Pollen Extract Other (See Comments)   Short Ragweed Pollen Ext Other (See Comments)   Banana Itching    Itchy Throat    "

## 2024-12-31 ENCOUNTER — Ambulatory Visit

## 2024-12-31 DIAGNOSIS — Z23 Encounter for immunization: Secondary | ICD-10-CM

## 2024-12-31 NOTE — Progress Notes (Signed)
 Pt here for tdap for PT program. Pt not seen by provider this visit.
